# Patient Record
Sex: Female | Born: 1950 | Race: White | Hispanic: No | Marital: Married | Smoking: Never smoker
Health system: Southern US, Community
[De-identification: ages and names within clinical notes are randomized; demographics above are authoritative.]

## PROBLEM LIST (undated history)

## (undated) ENCOUNTER — Ambulatory Visit: Payer: Medicare HMO

## (undated) DIAGNOSIS — I10 Essential (primary) hypertension: Secondary | ICD-10-CM

## (undated) DIAGNOSIS — R42 Dizziness and giddiness: Secondary | ICD-10-CM

## (undated) DIAGNOSIS — E039 Hypothyroidism, unspecified: Secondary | ICD-10-CM

## (undated) DIAGNOSIS — K219 Gastro-esophageal reflux disease without esophagitis: Secondary | ICD-10-CM

## (undated) HISTORY — PX: BLADDER SURGERY: SHX569

## (undated) HISTORY — PX: COLONOSCOPY: SHX174

---

## 2005-02-23 ENCOUNTER — Ambulatory Visit: Payer: Self-pay | Admitting: Specialist

## 2005-03-07 ENCOUNTER — Emergency Department: Payer: Self-pay | Admitting: Emergency Medicine

## 2005-03-13 ENCOUNTER — Emergency Department: Payer: Self-pay | Admitting: Emergency Medicine

## 2005-03-15 ENCOUNTER — Emergency Department: Payer: Self-pay | Admitting: General Practice

## 2005-03-24 ENCOUNTER — Ambulatory Visit: Payer: Self-pay

## 2007-09-21 ENCOUNTER — Ambulatory Visit: Payer: Self-pay | Admitting: Internal Medicine

## 2007-11-06 ENCOUNTER — Ambulatory Visit: Payer: Self-pay | Admitting: Endocrinology

## 2008-04-20 ENCOUNTER — Ambulatory Visit: Payer: Self-pay | Admitting: Unknown Physician Specialty

## 2012-07-22 ENCOUNTER — Ambulatory Visit: Payer: Self-pay | Admitting: Emergency Medicine

## 2012-07-22 LAB — URINALYSIS, COMPLETE
Bacteria: NEGATIVE
Bilirubin,UR: NEGATIVE
Glucose,UR: NEGATIVE mg/dL (ref 0–75)
Ketone: NEGATIVE
Nitrite: NEGATIVE
Ph: 6.5 (ref 4.5–8.0)
Specific Gravity: 1.015 (ref 1.003–1.030)

## 2012-07-22 LAB — CBC WITH DIFFERENTIAL/PLATELET
Bands: 1 %
Basophil #: 0 10*3/uL (ref 0.0–0.1)
Basophil %: 0.5 %
Comment - H1-Com2: NORMAL
Eosinophil #: 0 10*3/uL (ref 0.0–0.7)
Eosinophil %: 0 %
Eosinophil: 1 %
HCT: 35.6 % (ref 35.0–47.0)
HGB: 12.1 g/dL (ref 12.0–16.0)
Lymphocyte #: 0.6 10*3/uL — ABNORMAL LOW (ref 1.0–3.6)
Lymphocyte %: 18.9 %
Lymphocytes: 18 %
MCH: 33.3 pg (ref 26.0–34.0)
MCHC: 34.1 g/dL (ref 32.0–36.0)
MCV: 98 fL (ref 80–100)
Monocyte #: 0.4 x10 3/mm (ref 0.2–0.9)
Monocyte %: 13.3 %
Monocytes: 13 %
Myelocyte: 1 %
Neutrophil #: 2.1 10*3/uL (ref 1.4–6.5)
Neutrophil %: 67.3 %
Platelet: 212 10*3/uL (ref 150–440)
RBC: 3.65 10*6/uL — ABNORMAL LOW (ref 3.80–5.20)
RDW: 12.3 % (ref 11.5–14.5)
Segmented Neutrophils: 66 %
WBC: 3.2 10*3/uL — ABNORMAL LOW (ref 3.6–11.0)

## 2012-07-22 LAB — COMPREHENSIVE METABOLIC PANEL
Albumin: 3.5 g/dL (ref 3.4–5.0)
Alkaline Phosphatase: 92 U/L (ref 50–136)
Anion Gap: 9 (ref 7–16)
BUN: 9 mg/dL (ref 7–18)
Bilirubin,Total: 0.4 mg/dL (ref 0.2–1.0)
Calcium, Total: 8.6 mg/dL (ref 8.5–10.1)
Chloride: 97 mmol/L — ABNORMAL LOW (ref 98–107)
Co2: 25 mmol/L (ref 21–32)
Creatinine: 0.74 mg/dL (ref 0.60–1.30)
EGFR (African American): >60
EGFR (Non-African Amer.): >60
Glucose: 105 mg/dL — ABNORMAL HIGH (ref 65–99)
Osmolality: 262 (ref 275–301)
Potassium: 3.4 mmol/L — ABNORMAL LOW (ref 3.5–5.1)
SGOT(AST): 34 U/L (ref 15–37)
SGPT (ALT): 35 U/L
Sodium: 131 mmol/L — ABNORMAL LOW (ref 136–145)
Total Protein: 7.1 g/dL (ref 6.4–8.2)

## 2012-07-24 LAB — URINE CULTURE

## 2012-11-08 ENCOUNTER — Emergency Department: Payer: Self-pay | Admitting: Emergency Medicine

## 2012-11-08 LAB — URINALYSIS, COMPLETE
Ketone: NEGATIVE
Protein: 100
RBC,UR: 256 /HPF (ref 0–5)
Specific Gravity: 1.014 (ref 1.003–1.030)
Squamous Epithelial: <1
WBC UR: 247 /HPF (ref 0–5)

## 2014-07-27 ENCOUNTER — Ambulatory Visit: Payer: Self-pay | Admitting: Unknown Physician Specialty

## 2014-07-30 LAB — PATHOLOGY REPORT

## 2017-07-17 ENCOUNTER — Other Ambulatory Visit
Admission: RE | Admit: 2017-07-17 | Discharge: 2017-07-17 | Disposition: A | Discharge status: Home / Self Care | Payer: Medicare Other | Source: Ambulatory Visit | Attending: Student | Admitting: Student

## 2017-07-17 DIAGNOSIS — R197 Diarrhea, unspecified: Secondary | ICD-10-CM | POA: Insufficient documentation

## 2017-07-17 LAB — GASTROINTESTINAL PANEL BY PCR, STOOL (REPLACES STOOL CULTURE)

## 2017-07-17 LAB — C DIFFICILE QUICK SCREEN W PCR REFLEX
C DIFFICLE (CDIFF) ANTIGEN: NEGATIVE
C Diff interpretation: NOT DETECTED
C Diff toxin: NEGATIVE

## 2017-07-23 LAB — PANCREATIC ELASTASE, FECAL

## 2017-07-24 LAB — CALPROTECTIN, FECAL: Calprotectin, Fecal: 39 ug/g (ref 0–120)

## 2017-08-29 ENCOUNTER — Other Ambulatory Visit: Payer: Self-pay | Admitting: Orthopedic Surgery

## 2017-08-29 DIAGNOSIS — M1612 Unilateral primary osteoarthritis, left hip: Secondary | ICD-10-CM

## 2017-08-31 NOTE — Discharge Instructions (Signed)
Hip Injection, Care After Refer to this sheet in the next few weeks. These instructions provide you with information about caring for yourself after your procedure. Your health care provider may also give you more specific instructions. Your treatment has been planned according to current medical practices, but problems sometimes occur. Call your health care provider if you have any problems or questions after your procedure. What can I expect after the procedure? After the procedure, it is common to have:  Soreness.  Warmth.  Swelling.  You may have more pain, swelling, and warmth than you did before the injection. This reaction may last for about one day. Follow these instructions at home: Bathing  If you were given a bandage (dressing), keep it dry until your health care provider says it can be removed. Ask your health care provider when you can start showering or taking a bath. Managing pain, stiffness, and swelling  If directed, apply ice to the injection area: ? Put ice in a plastic bag. ? Place a towel between your skin and the bag. ? Leave the ice on for 20 minutes, 2-3 times per day.  Do not apply heat to your knee.  Raise the injection area above the level of your heart while you are sitting or lying down. Activity  Avoid strenuous activities for as long as directed by your health care provider. Ask your health care provider when you can return to your normal activities. General instructions  Take medicines only as directed by your health care provider.  Do not take aspirin or other over-the-counter medicines unless your health care provider says you can.  Check your injection site every day for signs of infection. Watch for: ? Redness, swelling, or pain. ? Fluid, blood, or pus.  Follow your health care providers instructions about dressing changes and removal. Contact a health care provider if:  You have symptoms at your injection site that last longer than two  days after your procedure.  You have redness, swelling, or pain in your injection area.  You have fluid, blood, or pus coming from your injection site.  You have warmth in your injection area.  You have a fever.  Your pain is not controlled with medicine. Get help right away if:  Your hip turns very red.  Your hip becomes very swollen.  Your hip pain is severe. This information is not intended to replace advice given to you by your health care provider. Make sure you discuss any questions you have with your health care provider. Document Released: 01/01/2015 Document Revised: 08/16/2016 Document Reviewed: 10/21/2014 Elsevier Interactive Patient Education  [employer]2018 Elsevier Inc.

## 2017-09-03 ENCOUNTER — Encounter: Payer: Self-pay | Admitting: Radiology

## 2017-09-03 ENCOUNTER — Ambulatory Visit
Admission: RE | Admit: 2017-09-03 | Discharge: 2017-09-03 | Disposition: A | Discharge status: Home / Self Care | Payer: Medicare Other | Source: Ambulatory Visit | Attending: Orthopedic Surgery | Admitting: Orthopedic Surgery

## 2017-09-03 DIAGNOSIS — M1612 Unilateral primary osteoarthritis, left hip: Secondary | ICD-10-CM | POA: Diagnosis present

## 2017-09-03 MED ORDER — METHYLPREDNISOLONE ACETATE 80 MG/ML IJ SUSP
INTRAMUSCULAR | Status: AC
  Administered 2017-09-03: 1 mg
  Filled 2017-09-03: qty 1

## 2017-09-03 MED ORDER — IOPAMIDOL (ISOVUE-200) INJECTION 41%
15.0000 mL | Freq: Once | INTRAVENOUS | Status: AC | PRN
  Administered 2017-09-03: 15 mL
  Filled 2017-09-03: qty 50

## 2017-09-03 MED ORDER — LIDOCAINE HCL (PF) 1 % IJ SOLN
5.0000 mL | Freq: Once | INTRAMUSCULAR | Status: AC
Start: 2017-09-03 — End: 2017-09-03
  Administered 2017-09-03: 5 mL
  Filled 2017-09-03: qty 5

## 2017-09-03 MED ORDER — BUPIVACAINE HCL (PF) 0.25 % IJ SOLN
INTRAMUSCULAR | Status: AC
  Administered 2017-09-03: 7 mL
  Filled 2017-09-03: qty 30

## 2019-01-15 ENCOUNTER — Other Ambulatory Visit: Payer: Self-pay | Admitting: Endocrinology

## 2019-01-15 DIAGNOSIS — Z1231 Encounter for screening mammogram for malignant neoplasm of breast: Secondary | ICD-10-CM

## 2019-02-19 ENCOUNTER — Ambulatory Visit
Admission: RE | Admit: 2019-02-19 | Discharge: 2019-02-19 | Disposition: A | Discharge status: Home / Self Care | Payer: Medicare HMO | Source: Ambulatory Visit | Attending: Endocrinology | Admitting: Endocrinology

## 2019-02-19 ENCOUNTER — Encounter: Payer: Self-pay | Admitting: Radiology

## 2019-02-19 DIAGNOSIS — Z1231 Encounter for screening mammogram for malignant neoplasm of breast: Secondary | ICD-10-CM | POA: Insufficient documentation

## 2020-01-23 ENCOUNTER — Ambulatory Visit: Payer: Medicare HMO

## 2020-01-31 ENCOUNTER — Ambulatory Visit: Payer: Medicare HMO | Attending: Internal Medicine

## 2020-01-31 DIAGNOSIS — Z23 Encounter for immunization: Secondary | ICD-10-CM | POA: Insufficient documentation

## 2020-01-31 NOTE — Progress Notes (Signed)
   Covid-19 Vaccination Clinic  Name:  Jasmine Robertson    MRN: [id number] DOB: March 02, 1951  01/31/2020  Jasmine Robertson was observed post Covid-19 immunization for 15 minutes without incidence. She was provided with Vaccine Information Sheet and instruction to access the V-Safe system.   Jasmine Robertson was instructed to call 911 with any severe reactions post vaccine: Marland Kitchen Difficulty breathing  . Swelling of your face and throat  . A fast heartbeat  . A bad rash all over your body  . Dizziness and weakness    Immunizations Administered    Name Date Dose VIS Date Route   Pfizer COVID-19 Vaccine 01/31/2020  3:12 PM 0.3 mL 12/05/2019 Intramuscular   Manufacturer: [employer], [employer]   Lot: [id number]   NDC: [address]-3

## 2020-02-13 ENCOUNTER — Ambulatory Visit: Payer: Medicare HMO

## 2020-02-25 ENCOUNTER — Ambulatory Visit: Payer: Medicare HMO

## 2020-02-26 ENCOUNTER — Ambulatory Visit: Payer: Medicare HMO | Attending: Internal Medicine

## 2020-02-26 DIAGNOSIS — Z23 Encounter for immunization: Secondary | ICD-10-CM | POA: Insufficient documentation

## 2020-02-26 NOTE — Progress Notes (Signed)
   Covid-19 Vaccination Clinic  Name:  Jasmine Robertson    MRN: [id number] DOB: [birth year]  02/26/2020  [name] was observed post Covid-19 immunization for 15 minutes without incident. She was provided with Vaccine Information Sheet and instruction to access the V-Safe system.   [name] was instructed to call 911 with any severe reactions post vaccine: Marland Kitchen Difficulty breathing  . Swelling of face and throat  . A fast heartbeat  . A bad rash all over body  . Dizziness and weakness   Immunizations Administered    Name Date Dose VIS Date Route   Pfizer COVID-19 Vaccine 02/26/2020  5:23 PM 0.3 mL 12/05/2019 Intramuscular   Manufacturer: [employer], [employer]   Lot: [id number]   NDC: [address]-3

## 2020-04-21 ENCOUNTER — Other Ambulatory Visit: Payer: Self-pay | Admitting: Neurosurgery

## 2020-04-21 DIAGNOSIS — S32810K Multiple fractures of pelvis with stable disruption of pelvic ring, subsequent encounter for fracture with nonunion: Secondary | ICD-10-CM

## 2020-04-26 ENCOUNTER — Other Ambulatory Visit: Payer: Self-pay | Admitting: Endocrinology

## 2020-04-26 DIAGNOSIS — Z1231 Encounter for screening mammogram for malignant neoplasm of breast: Secondary | ICD-10-CM

## 2020-04-27 ENCOUNTER — Ambulatory Visit
Admission: RE | Admit: 2020-04-27 | Discharge: 2020-04-27 | Disposition: A | Discharge status: Home / Self Care | Payer: Medicare HMO | Source: Ambulatory Visit | Attending: Endocrinology | Admitting: Endocrinology

## 2020-04-27 ENCOUNTER — Other Ambulatory Visit: Payer: Self-pay

## 2020-04-27 DIAGNOSIS — Z1231 Encounter for screening mammogram for malignant neoplasm of breast: Secondary | ICD-10-CM | POA: Insufficient documentation

## 2020-05-05 ENCOUNTER — Ambulatory Visit
Admission: RE | Admit: 2020-05-05 | Discharge: 2020-05-05 | Disposition: A | Discharge status: Home / Self Care | Payer: Medicare HMO | Source: Ambulatory Visit | Attending: Neurosurgery | Admitting: Neurosurgery

## 2020-05-05 ENCOUNTER — Other Ambulatory Visit: Payer: Self-pay

## 2020-05-05 DIAGNOSIS — S32810K Multiple fractures of pelvis with stable disruption of pelvic ring, subsequent encounter for fracture with nonunion: Secondary | ICD-10-CM | POA: Diagnosis present

## 2020-05-05 DIAGNOSIS — X58XXXA Exposure to other specified factors, initial encounter: Secondary | ICD-10-CM | POA: Insufficient documentation

## 2020-05-05 DIAGNOSIS — M7061 Trochanteric bursitis, right hip: Secondary | ICD-10-CM | POA: Insufficient documentation

## 2020-05-05 DIAGNOSIS — M160 Bilateral primary osteoarthritis of hip: Secondary | ICD-10-CM | POA: Insufficient documentation

## 2020-05-05 DIAGNOSIS — M47816 Spondylosis without myelopathy or radiculopathy, lumbar region: Secondary | ICD-10-CM | POA: Insufficient documentation

## 2020-05-05 DIAGNOSIS — M7062 Trochanteric bursitis, left hip: Secondary | ICD-10-CM | POA: Diagnosis not present

## 2020-11-26 ENCOUNTER — Other Ambulatory Visit: Payer: Self-pay | Admitting: Internal Medicine

## 2020-11-26 ENCOUNTER — Ambulatory Visit: Payer: Medicare HMO | Attending: Internal Medicine

## 2020-11-26 DIAGNOSIS — Z23 Encounter for immunization: Secondary | ICD-10-CM

## 2020-11-26 NOTE — Progress Notes (Signed)
   Covid-19 Vaccination Clinic  Name:  Jasmine Robertson    MRN: [id number] DOB: 11/07/1951  11/26/2020  [name] was observed post Covid-19 immunization for 15 minutes without incident. She was provided with Vaccine Information Sheet and instruction to access the V-Safe system.   [name] was instructed to call 911 with any severe reactions post vaccine: Marland Kitchen Difficulty breathing  . Swelling of face and throat  . A fast heartbeat  . A bad rash all over body  . Dizziness and weakness   Immunizations Administered    Name Date Dose VIS Date Route   Pfizer COVID-19 Vaccine 11/26/2020  1:31 PM 0.3 mL 10/13/2020 Intramuscular   Manufacturer: [employer], [employer]   Lot: [id number]   NDC: [address]-4

## 2021-06-02 ENCOUNTER — Other Ambulatory Visit: Payer: Self-pay | Admitting: Endocrinology

## 2021-06-02 DIAGNOSIS — Z1231 Encounter for screening mammogram for malignant neoplasm of breast: Secondary | ICD-10-CM

## 2021-06-15 ENCOUNTER — Other Ambulatory Visit: Payer: Self-pay

## 2021-06-15 ENCOUNTER — Ambulatory Visit
Admission: RE | Admit: 2021-06-15 | Discharge: 2021-06-15 | Disposition: A | Discharge status: Home / Self Care | Payer: Medicare HMO | Source: Ambulatory Visit | Attending: Endocrinology | Admitting: Endocrinology

## 2021-06-15 DIAGNOSIS — Z1231 Encounter for screening mammogram for malignant neoplasm of breast: Secondary | ICD-10-CM | POA: Insufficient documentation

## 2021-06-26 ENCOUNTER — Ambulatory Visit
Admission: EM | Admit: 2021-06-26 | Discharge: 2021-06-26 | Disposition: A | Discharge status: Home / Self Care | Payer: Medicare HMO

## 2021-06-26 ENCOUNTER — Other Ambulatory Visit: Payer: Self-pay

## 2021-06-26 ENCOUNTER — Encounter: Payer: Self-pay | Admitting: Emergency Medicine

## 2021-06-26 DIAGNOSIS — K209 Esophagitis, unspecified without bleeding: Secondary | ICD-10-CM

## 2021-06-26 IMAGING — MG DIGITAL SCREENING BILAT W/ TOMO W/ CAD
8 series · 8 of 24 positions shown · non-contrast
Comparison: Previous exam(s).

CLINICAL DATA: Screening.

EXAM:
DIGITAL SCREENING BILATERAL MAMMOGRAM WITH TOMO AND CAD

[R CC synth-2D]
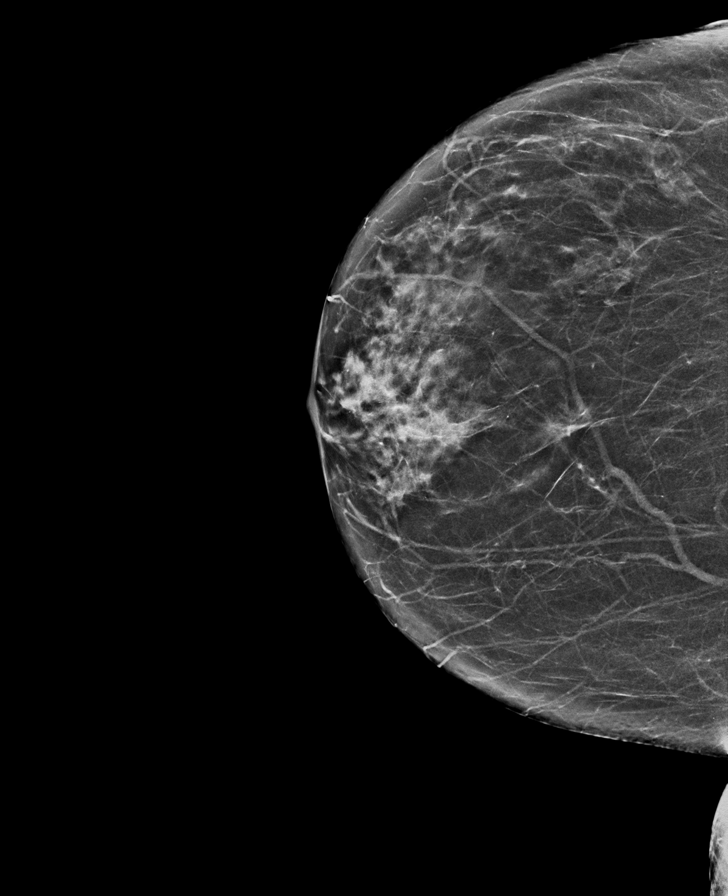

[R MLO synth-2D]
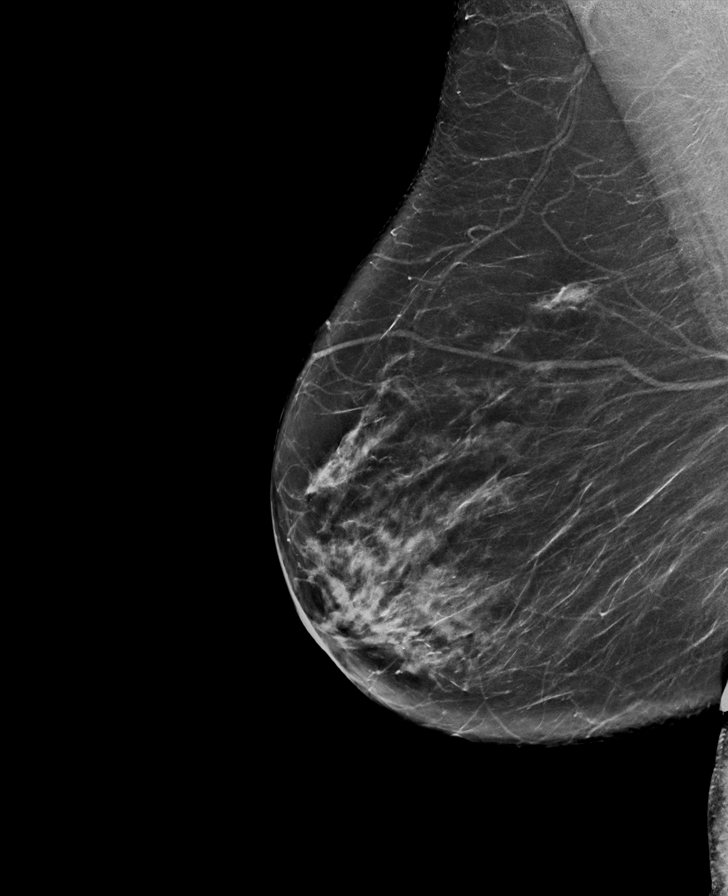

[L MLO synth-2D]
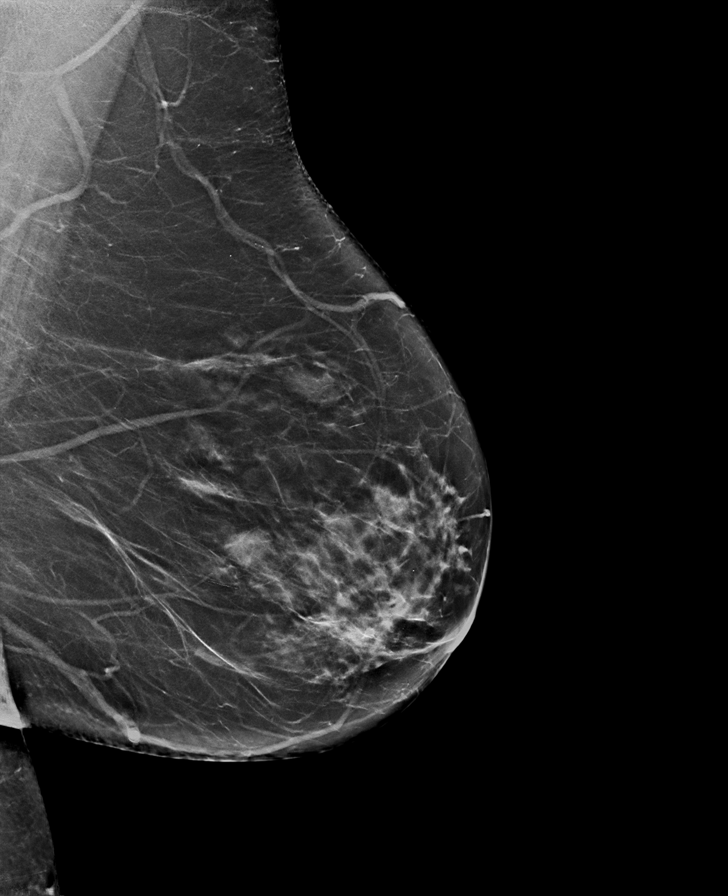

[L CC synth-2D]
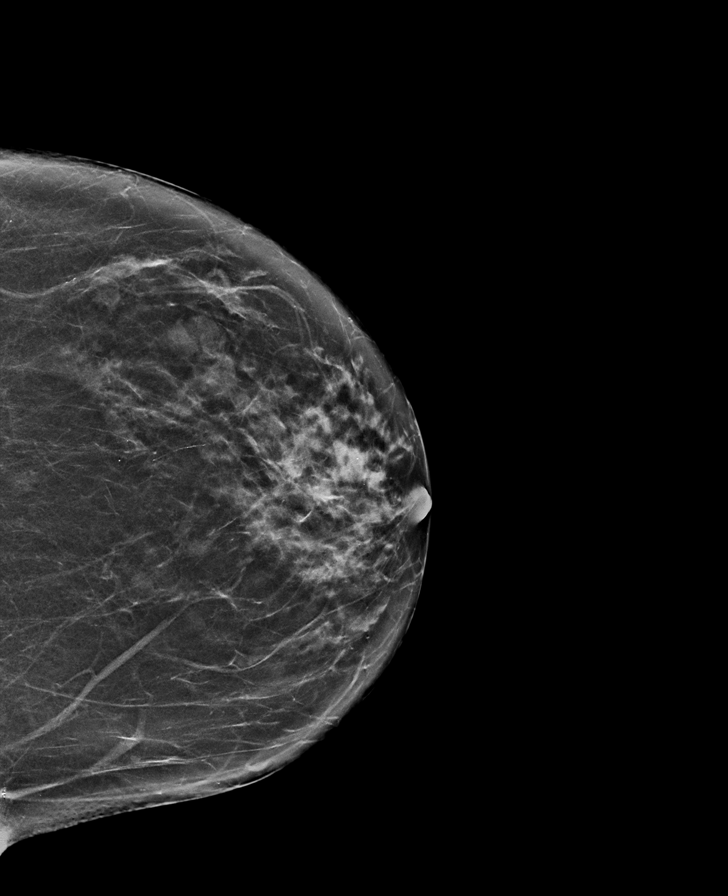

[L MLO tomo · tomo slice 38/75.0]
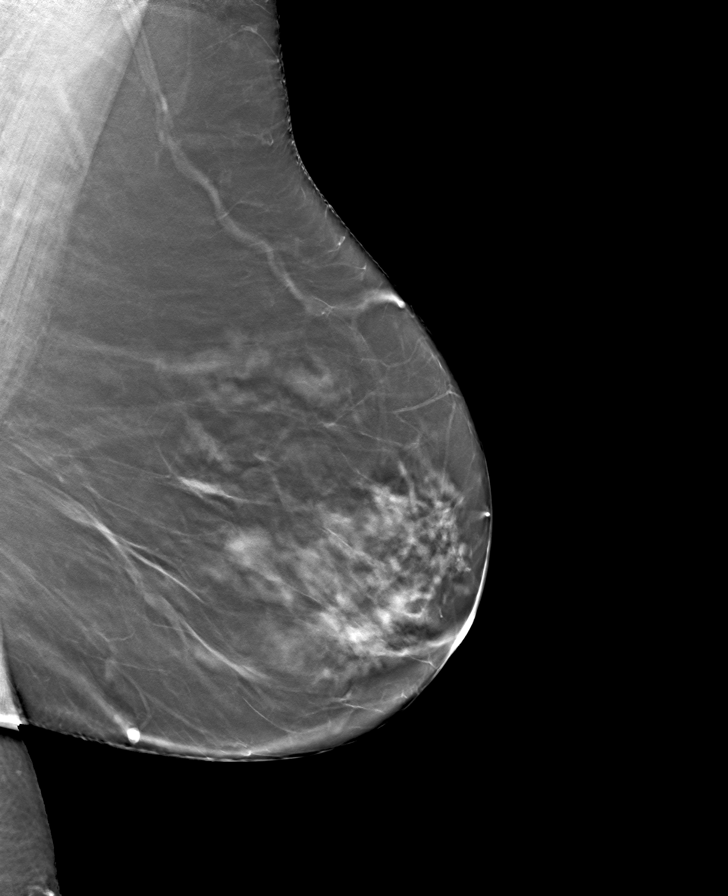

[R MLO tomo · tomo slice 39/77.0]
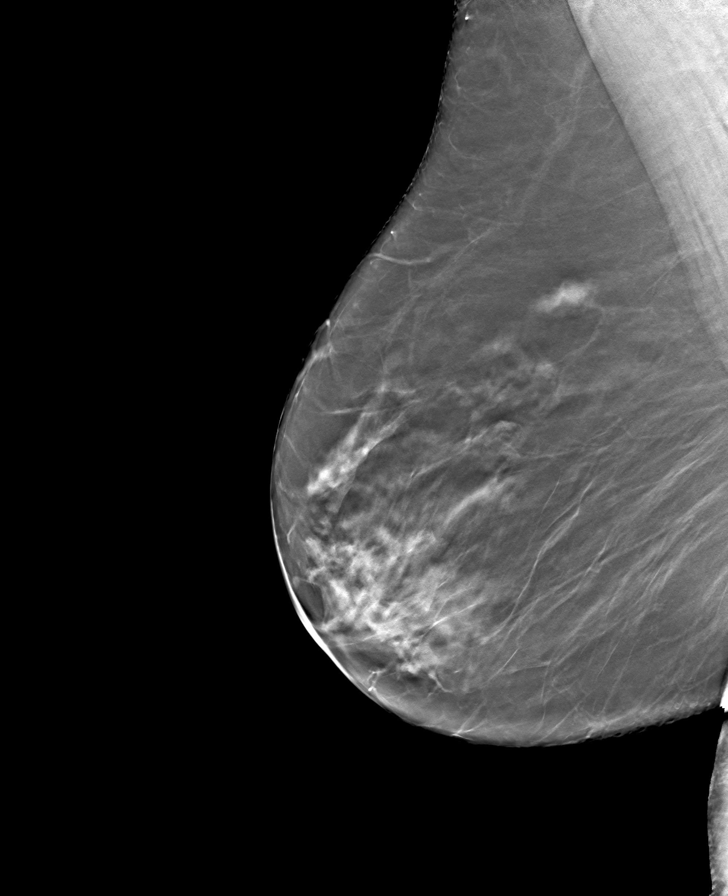

[L CC tomo · tomo slice 33/65.0]
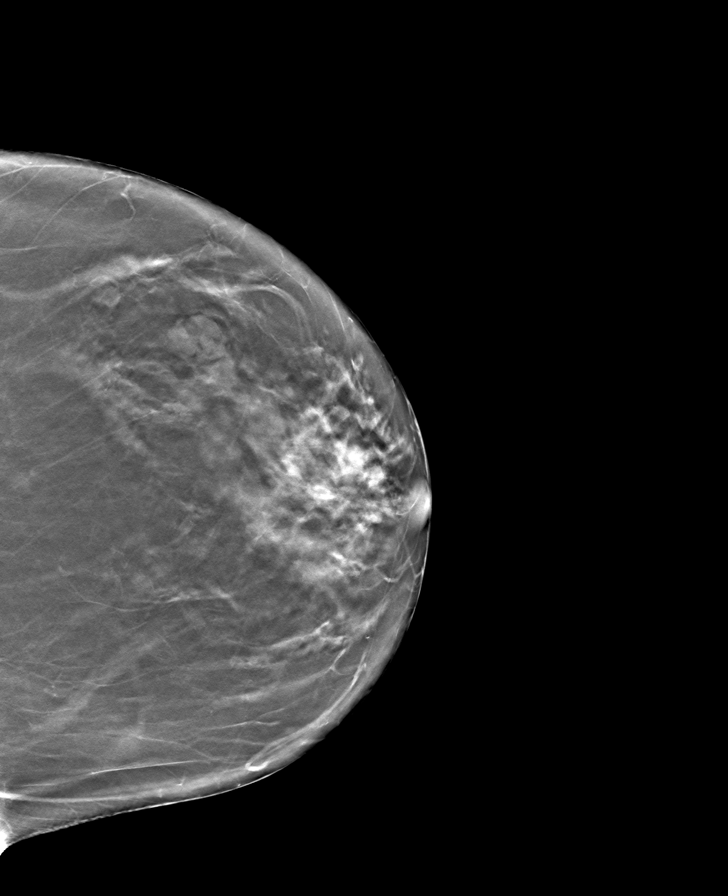

[R CC tomo · tomo slice 33/64.0]
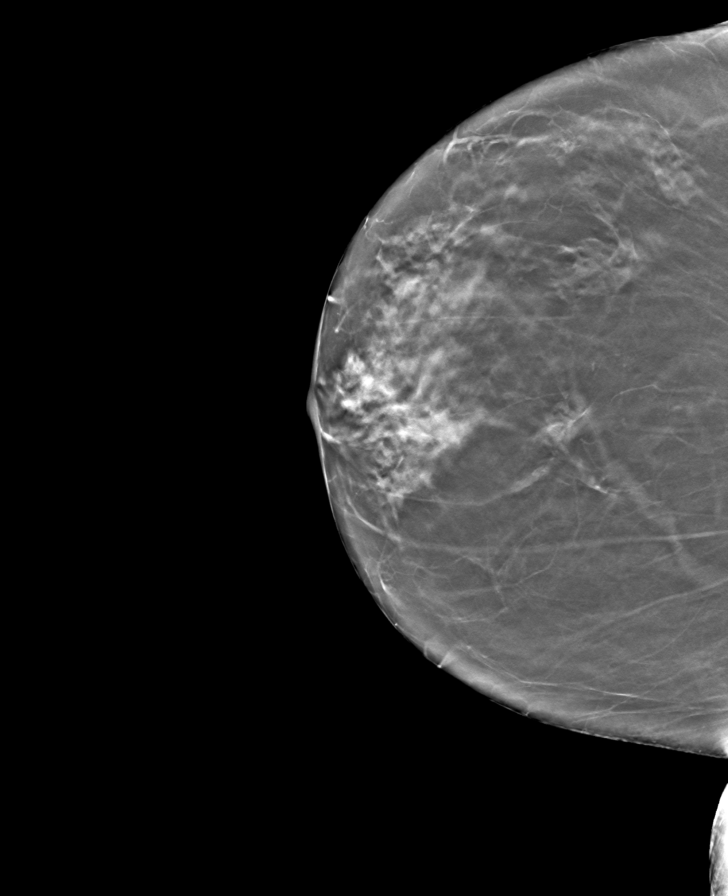

[8 of 24 positions shown; findings below may reference images not displayed]

ACR Breast Density Category c: The breast tissue is heterogeneously
dense, which may obscure small masses.
FINDINGS: There are no findings suspicious for malignancy. Images were
processed with CAD.
IMPRESSION: No mammographic evidence of malignancy. A result letter of this
screening mammogram will be mailed directly to the patient.

RECOMMENDATION:
Screening mammogram in one year. (Code:[id number])

BI-RADS CATEGORY  1: Negative.

## 2021-06-26 MED ORDER — ALUM & MAG HYDROXIDE-SIMETH 200-200-20 MG/5ML PO SUSP
30.0000 mL | Freq: Once | ORAL | Status: AC
  Administered 2021-06-26: 30 mL via ORAL

## 2021-06-26 MED ORDER — LIDOCAINE VISCOUS HCL 2 % MT SOLN
15.0000 mL | Freq: Once | OROMUCOSAL | Status: AC
  Administered 2021-06-26: 15 mL via ORAL

## 2021-06-26 NOTE — ED Triage Notes (Signed)
Pt presents today with c/o of mid chest pain that began this morning. She reports feeling better after belching several times, but continues to complain of chest pain described as "pressure".

## 2021-06-26 NOTE — Discharge Instructions (Addendum)
Continue your pantoprazole for treatment of your reflux disease and add on over-the-counter Gaviscon liquid up to 4 times a day to help coat your esophagus and decrease your symptomology.  Make a follow-up appointment with gastroenterology to discuss your symptoms as you may need to be tested for H. pylori or have another endoscopy.  ED if you develop an increase in your chest pain, especially associated with nausea, sweating, or radiation to your arm or neck you need to go to the emergency department for evaluation.

## 2021-06-26 NOTE — ED Provider Notes (Signed)
[facility]    CSN: [id number] Arrival date & time: 06/26/21  1400      History   Chief Complaint Chief Complaint  Patient presents with   Chest Pain    HPI JUNELLA DOMKE is a 70 y.o. female.   HPI  71 year old female here for evaluation of midsternal chest pressure.  Patient reports that she has been dealing with midsternal chest pressure since she woke up this morning.  She states that she was belching excessively which has helped some of the pressure in the middle of her chest but she is still having that midsternal pressure rating it 3-4 over 10.  This is associated with nausea.  She also reports that since she has had the belching that she has been having pain whenever she swallows food or fluids.  The pain/pressure does not radiate.  It is not associated with shortness of breath or sweating.  Patient has no prior cardiac history but does have a history of gastritis and esophagitis with gastroesophageal reflux disease and is on pantoprazole at present.  Patient's last EGD was in 2018 which showed the gastritis.  History reviewed. No pertinent past medical history.  There are no problems to display for this patient.   History reviewed. No pertinent surgical history.  OB History   No obstetric history on file.      Home Medications    Prior to Admission medications   Medication Sig Start Date End Date Taking? Authorizing Provider  alendronate (FOSAMAX) 70 MG tablet Take by mouth. 03/09/21  Yes [provider]  levothyroxine (SYNTHROID) 50 MCG tablet Take by mouth. 06/19/18  Yes [provider]  pantoprazole (PROTONIX) 40 MG tablet Take 40 mg by mouth daily.   Yes [provider]  amoxicillin (AMOXIL) 500 MG capsule Take 2,000 mg by mouth once. Prior to dental procedure    [provider]  aspirin 81 MG EC tablet aspirin 81 mg tablet,delayed release  Take 1 tablet every day by oral route.    [provider]   aspirin EC 325 MG tablet Take 325 mg by mouth daily.    [provider]  azithromycin (ZITHROMAX) 250 MG tablet Take by mouth daily.    [provider]  benzonatate (TESSALON) 100 MG capsule Take by mouth 3 (three) times daily as needed for cough.    [provider]  calcium-vitamin D (OSCAL WITH D) 250-125 MG-UNIT tablet Take 1 tablet by mouth daily.    [provider]  COVID-19 mRNA vaccine, Pfizer, 30 MCG/0.3ML injection USE AS DIRECTED 11/26/20 11/26/21  Judyann Munson, MD  cyanocobalamin 1000 MCG tablet Take 1,000 mcg by mouth daily.    [provider]  doxepin (SINEQUAN) 50 MG capsule Take 50 mg by mouth at bedtime. 06/20/21   [provider]  metoprolol succinate (TOPROL-XL) 25 MG 24 hr tablet Take by mouth. 06/05/21   [provider]  oxycodone (OXY-IR) 5 MG capsule Take 5 mg by mouth every 4 (four) hours as needed.    [provider]  simvastatin (ZOCOR) 20 MG tablet Take 20 mg by mouth daily. 05/30/21   [provider]  terbinafine (LAMISIL) 250 MG tablet Take 250 mg by mouth daily. 06/15/21   [provider]  traMADol (ULTRAM) 50 MG tablet Take by mouth every 6 (six) hours as needed.    [provider]    Family History Family History  Problem Relation Age of Onset   Breast cancer Mother  66       x2    Social History Social History   Tobacco Use   Smoking status: Never   Smokeless tobacco: Never  Substance Use Topics   Alcohol use: Never   Drug use: Never     Allergies   Patient has no known allergies.   Review of Systems Review of Systems  Constitutional:  Negative for activity change, appetite change, diaphoresis and fever.  Respiratory:  Negative for shortness of breath and wheezing.   Cardiovascular:  Positive for chest pain. Negative for palpitations.  Gastrointestinal:  Positive for nausea. Negative for vomiting.  Skin:  Negative for rash.  Hematological:  Negative.   Psychiatric/Behavioral: Negative.      Physical Exam Triage Vital Signs ED Triage Vitals  Enc Vitals Group     BP 06/26/21 1415 137/68     Pulse Rate 06/26/21 1415 80     Resp 06/26/21 1415 16     Temp 06/26/21 1415 97.9 F (36.6 C)     Temp Source 06/26/21 1415 Oral     SpO2 06/26/21 1415 100 %     Weight 06/26/21 1410 171 lb (77.6 kg)     Height 06/26/21 1410 5\' 5"  (1.651 m)     Head Circumference --      Peak Flow --      Pain Score 06/26/21 1409 4     Pain Loc --      Pain Edu? --      Excl. in GC? --    No data found.  Updated Vital Signs BP 137/68 (BP Location: Right Arm)   Pulse 80   Temp 97.9 F (36.6 C) (Oral)   Resp 16   Ht 5\' 5"  (1.651 m)   Wt 171 lb (77.6 kg)   SpO2 100%   BMI 28.46 kg/m   Visual Acuity Right Eye Distance:   Left Eye Distance:   Bilateral Distance:    Right Eye Near:   Left Eye Near:    Bilateral Near:     Physical Exam   UC Treatments / Results  Labs (all labs ordered are listed, but only abnormal results are displayed) Labs Reviewed - No data to display  EKG   Radiology No results found.  Procedures Procedures (including critical care time)  Medications Ordered in UC Medications  alum & mag hydroxide-simeth (MAALOX/MYLANTA) 200-200-20 MG/5ML suspension 30 mL (30 mLs Oral Given 06/26/21 1452)    And  lidocaine (XYLOCAINE) 2 % viscous mouth solution 15 mL (15 mLs Oral Given 06/26/21 1452)    Initial Impression / Assessment and Plan / UC Course  I have reviewed the triage vital signs and the nursing notes.  Pertinent labs & imaging results that were available during my care of the patient were reviewed by me and considered in my medical decision making (see chart for details).  Patient is a very pleasant and nontoxic-appearing 70 year old female here for evaluation of mid chest pressure as outlined in HPI above.  Patient's physical exam reveals a benign cardiopulmonary exam.  Abdomen is protuberant but  soft and nontender.  EKG collected at triage shows normal sinus rhythm with a ventricular rate of 78 bpm, PR interval 160 ms, QRS duration of 102 ms, QT 398 ms, and QTc 453 ms there appears to be an incomplete left bundle branch block which I am unsure if that is new or old as we have no prior EKGs for comparison.  There is definite concern that patient's chest  pain is cardiac in nature but may also be associated with esophagitis or esophageal spasm.  Will administer GI cocktail to see if patient's symptoms improve or resolve.  Patient vies that if the GI cocktail does not resolve her chest pressure that she needs to be evaluated in the emergency department to rule out a cardiac cause of her chest pressure.  Patient is agreeable and verbalizes understanding.  Reports that she has had significant improvement of her chest pressure since taking the GI cocktail.  Will discharge patient home and have her continue her pantoprazole and have her add on over-the-counter Gaviscon to help with esophageal inflammation.  Of also encouraged her to follow-up with gastroenterology if her symptoms continue.   Final Clinical Impressions(s) / UC Diagnoses   Final diagnoses:  Acute esophagitis     Discharge Instructions      Continue your pantoprazole for treatment of your reflux disease and add on over-the-counter Gaviscon liquid up to 4 times a day to help coat your esophagus and decrease your symptomology.  Make a follow-up appointment with gastroenterology to discuss your symptoms as you may need to be tested for H. pylori or have another endoscopy.  ED if you develop an increase in your chest pain, especially associated with nausea, sweating, or radiation to your arm or neck you need to go to the emergency department for evaluation.     ED Prescriptions   None    PDMP not reviewed this encounter.   Becky Augusta, NP 06/26/21 1520

## 2022-05-19 ENCOUNTER — Other Ambulatory Visit: Payer: Self-pay | Admitting: Endocrinology

## 2022-05-19 DIAGNOSIS — Z1231 Encounter for screening mammogram for malignant neoplasm of breast: Secondary | ICD-10-CM

## 2022-06-20 ENCOUNTER — Ambulatory Visit
Admission: RE | Admit: 2022-06-20 | Discharge: 2022-06-20 | Disposition: A | Discharge status: Home / Self Care | Payer: Medicare HMO | Source: Ambulatory Visit | Attending: Endocrinology | Admitting: Endocrinology

## 2022-06-20 DIAGNOSIS — Z1231 Encounter for screening mammogram for malignant neoplasm of breast: Secondary | ICD-10-CM | POA: Insufficient documentation

## 2022-07-30 LAB — COLOGUARD

## 2022-08-11 ENCOUNTER — Other Ambulatory Visit: Payer: Self-pay

## 2022-08-11 ENCOUNTER — Telehealth: Payer: Self-pay

## 2022-08-11 DIAGNOSIS — K219 Gastro-esophageal reflux disease without esophagitis: Secondary | ICD-10-CM

## 2022-08-11 DIAGNOSIS — R195 Other fecal abnormalities: Secondary | ICD-10-CM

## 2022-08-11 MED ORDER — NA SULFATE-K SULFATE-MG SULF 17.5-3.13-1.6 GM/177ML PO SOLN: 1.0000 | Freq: Once | ORAL | 0 refills | Status: AC

## 2022-08-11 NOTE — Telephone Encounter (Signed)
Gastroenterology Pre-Procedure Review  Request Date: 09/28/22 Requesting Physician: Dr. Servando Snare  PATIENT REVIEW QUESTIONS: The patient responded to the following health history questions as indicated:    1. Are you having any GI issues? yes (positive colorectal screening, acid reflux patient request upper endoscopy) 2. Do you have a personal history of Polyps? no 3. Do you have a family history of Colon Cancer or Polyps? yes (brother died at the age of 3 with rectal cancer) 4. Diabetes Mellitus? no 5. Joint replacements in the past 12 months?no 6. Major health problems in the past 3 months?no 7. Any artificial heart valves, MVP, or defibrillator?no    MEDICATIONS & ALLERGIES:    Patient reports the following regarding taking any anticoagulation/antiplatelet therapy:   Plavix, Coumadin, Eliquis, Xarelto, Lovenox, Pradaxa, Brilinta, or Effient? no Aspirin? Not taking Aspirin 325mg  taking Aspirin 81mg   Patient confirms/reports the following medications:  Current Outpatient Medications  Medication Sig Dispense Refill   alendronate (FOSAMAX) 70 MG tablet Take by mouth.     amoxicillin (AMOXIL) 500 MG capsule Take 2,000 mg by mouth once. Prior to dental procedure     aspirin 81 MG EC tablet aspirin 81 mg tablet,delayed release  Take 1 tablet every day by oral route.     aspirin EC 325 MG tablet Take 325 mg by mouth daily.     azithromycin (ZITHROMAX) 250 MG tablet Take by mouth daily.     benzonatate (TESSALON) 100 MG capsule Take by mouth 3 (three) times daily as needed for cough.     calcium-vitamin D (OSCAL WITH D) 250-125 MG-UNIT tablet Take 1 tablet by mouth daily.     cyanocobalamin 1000 MCG tablet Take 1,000 mcg by mouth daily.     doxepin (SINEQUAN) 50 MG capsule Take 50 mg by mouth at bedtime.     levothyroxine (SYNTHROID) 50 MCG tablet Take by mouth.     metoprolol succinate (TOPROL-XL) 25 MG 24 hr tablet Take by mouth.     oxycodone (OXY-IR) 5 MG capsule Take 5 mg by mouth every  4 (four) hours as needed.     pantoprazole (PROTONIX) 40 MG tablet Take 40 mg by mouth daily.     simvastatin (ZOCOR) 20 MG tablet Take 20 mg by mouth daily.     terbinafine (LAMISIL) 250 MG tablet Take 250 mg by mouth daily.     traMADol (ULTRAM) 50 MG tablet Take by mouth every 6 (six) hours as needed.     No current facility-administered medications for this visit.    Patient confirms/reports the following allergies:  No Known Allergies  No orders of the defined types were placed in this encounter.   AUTHORIZATION INFORMATION Primary Insurance: 1D#: Group #:  Secondary Insurance: 1D#: Group #:  SCHEDULE INFORMATION: Date: 09/28/22 Time: Location: [facility]

## 2022-08-11 NOTE — Telephone Encounter (Signed)
Patient has been informed that Dr. Servando Snare will be able to perform EGD.  I've asked [facility] to add EGD to patients colonoscopy.

## 2022-08-14 ENCOUNTER — Encounter: Payer: Self-pay | Admitting: Endocrinology

## 2022-09-20 ENCOUNTER — Other Ambulatory Visit: Payer: Self-pay

## 2022-09-20 ENCOUNTER — Encounter: Payer: Self-pay | Admitting: Gastroenterology

## 2022-09-21 ENCOUNTER — Encounter: Payer: Self-pay | Admitting: Anesthesiology

## 2022-09-28 ENCOUNTER — Ambulatory Visit
Admission: RE | Admit: 2022-09-28 | Discharge: 2022-09-28 | Disposition: A | Discharge status: Home / Self Care | Payer: Medicare HMO | Attending: Gastroenterology | Admitting: Gastroenterology

## 2022-09-28 ENCOUNTER — Encounter: Payer: Self-pay | Admitting: Gastroenterology

## 2022-09-28 ENCOUNTER — Encounter
Admission: RE | Disposition: A | Discharge status: Home / Self Care | Payer: Self-pay | Source: Home / Self Care | Attending: Gastroenterology

## 2022-09-28 ENCOUNTER — Other Ambulatory Visit: Payer: Self-pay

## 2022-09-28 HISTORY — DX: Gastro-esophageal reflux disease without esophagitis: K21.9

## 2022-09-28 HISTORY — DX: Hypothyroidism, unspecified: E03.9

## 2022-09-28 SURGERY — COLONOSCOPY WITH PROPOFOL
Anesthesia: Choice

## 2022-09-28 SURGICAL SUPPLY — 36 items
BALLN DILATOR 12-15 8 (BALLOONS)
BALLN DILATOR 15-18 8 (BALLOONS)
BALLN DILATOR CRE 0-12 8 (BALLOONS)
BALLN DILATOR ESOPH 8 10 CRE (MISCELLANEOUS) IMPLANT
BALLOON DILATOR 12-15 8 (BALLOONS) IMPLANT
BALLOON DILATOR 15-18 8 (BALLOONS) IMPLANT
BALLOON DILATOR CRE 0-12 8 (BALLOONS) IMPLANT
BLOCK BITE 60FR ADLT L/F GRN (MISCELLANEOUS) ×1 IMPLANT
CLIP HMST 235XBRD CATH ROT (MISCELLANEOUS) IMPLANT
CLIP RESOLUTION 360 11X235 (MISCELLANEOUS)
ELECT REM PT RETURN 9FT ADLT (ELECTROSURGICAL)
ELECTRODE REM PT RTRN 9FT ADLT (ELECTROSURGICAL) IMPLANT
FCP ESCP3.2XJMB 240X2.8X (MISCELLANEOUS)
FORCEPS BIOP RAD 4 LRG CAP 4 (CUTTING FORCEPS) IMPLANT
FORCEPS BIOP RJ4 240 W/NDL (MISCELLANEOUS)
FORCEPS ESCP3.2XJMB 240X2.8X (MISCELLANEOUS) IMPLANT
GOWN CVR UNV OPN BCK APRN NK (MISCELLANEOUS) ×2 IMPLANT
GOWN ISOL THUMB LOOP REG UNIV (MISCELLANEOUS) ×2
INJECTOR VARIJECT VIN23 (MISCELLANEOUS) IMPLANT
KIT DEFENDO VALVE AND CONN (KITS) IMPLANT
KIT PRC NS LF DISP ENDO (KITS) ×1 IMPLANT
KIT PROCEDURE OLYMPUS (KITS) ×1
MANIFOLD NEPTUNE II (INSTRUMENTS) ×1 IMPLANT
MARKER SPOT ENDO TATTOO 5ML (MISCELLANEOUS) IMPLANT
PROBE APC STR FIRE (PROBE) IMPLANT
RETRIEVER NET PLAT FOOD (MISCELLANEOUS) IMPLANT
RETRIEVER NET ROTH 2.5X230 LF (MISCELLANEOUS) IMPLANT
SNARE COLD EXACTO (MISCELLANEOUS) IMPLANT
SNARE SHORT THROW 13M SML OVAL (MISCELLANEOUS) IMPLANT
SNARE SHORT THROW 30M LRG OVAL (MISCELLANEOUS) IMPLANT
SNARE SNG USE RND 15MM (INSTRUMENTS) IMPLANT
SYR INFLATION 60ML (SYRINGE) IMPLANT
TRAP ETRAP POLY (MISCELLANEOUS) IMPLANT
VARIJECT INJECTOR VIN23 (MISCELLANEOUS)
WATER STERILE IRR 250ML POUR (IV SOLUTION) ×1 IMPLANT
WIRE CRE 18-20MM 8CM F G (MISCELLANEOUS) IMPLANT

## 2022-09-28 NOTE — Progress Notes (Signed)
Procedure cancelled before the patient was brought to the endoscopy room due to a new irregular heart beat.

## 2022-09-28 NOTE — H&P (Addendum)
Lucilla Lame, MD [facility] [address], [place], [place] [address] Phone:[phone] Fax : [phone]  Primary Care Physician:  Lenard Simmer, MD Primary Gastroenterologist:  Dr. Allen Norris  Pre-Procedure History & Physical: HPI:  Jasmine Robertson is a 71 y.o. female is here for an colonoscopy and upper endoscopy.   Past Medical History:  Diagnosis Date   GERD (gastroesophageal reflux disease)    Hypothyroidism     Past Surgical History:  Procedure Laterality Date   BLADDER SURGERY      Prior to Admission medications   Medication Sig Start Date End Date Taking? Authorizing Provider  alendronate (FOSAMAX) 70 MG tablet Take by mouth. Patient not taking: Reported on 09/20/2022 03/09/21   [provider]  amoxicillin (AMOXIL) 500 MG capsule Take 2,000 mg by mouth once. Prior to dental procedure Patient not taking: Reported on 09/20/2022    [provider]  aspirin 81 MG EC tablet aspirin 81 mg tablet,delayed release  Take 1 tablet every day by oral route.    [provider]  aspirin EC 325 MG tablet Take 325 mg by mouth daily. Patient not taking: Reported on 09/20/2022    [provider]  azithromycin (ZITHROMAX) 250 MG tablet Take by mouth daily. Patient not taking: Reported on 09/20/2022    [provider]  benzonatate (TESSALON) 100 MG capsule Take by mouth 3 (three) times daily as needed for cough. Patient not taking: Reported on 09/20/2022    [provider]  calcium-vitamin D (OSCAL WITH D) 250-125 MG-UNIT tablet Take 1 tablet by mouth daily. Patient not taking: Reported on 09/20/2022    [provider]  cyanocobalamin 1000 MCG tablet Take 1,000 mcg by mouth daily. Patient not taking: Reported on 09/20/2022    [provider]  doxepin (SINEQUAN) 50 MG capsule Take 50 mg by mouth at bedtime. Patient not taking: Reported on 09/20/2022 06/20/21   [provider]  levothyroxine (SYNTHROID) 50 MCG  tablet Take by mouth. 06/19/18   [provider]  metoprolol succinate (TOPROL-XL) 25 MG 24 hr tablet Take by mouth. 06/05/21   [provider]  oxycodone (OXY-IR) 5 MG capsule Take 5 mg by mouth every 4 (four) hours as needed. Patient not taking: Reported on 09/20/2022    [provider]  pantoprazole (PROTONIX) 40 MG tablet Take 40 mg by mouth daily.    [provider]  simvastatin (ZOCOR) 20 MG tablet Take 20 mg by mouth daily. 05/30/21   [provider]  terbinafine (LAMISIL) 250 MG tablet Take 250 mg by mouth daily. Patient not taking: Reported on 09/20/2022 06/15/21   [provider]  traMADol (ULTRAM) 50 MG tablet Take by mouth every 6 (six) hours as needed.    [provider]    Allergies as of 08/11/2022   (No Known Allergies)    Family History  Problem Relation Age of Onset   Breast cancer Mother 87       x2    Social History   Socioeconomic History   Marital status: Married    Spouse name: Not on file   Number of children: Not on file   Years of education: Not on file   Highest education level: Not on file  Occupational History   Not on file  Tobacco Use   Smoking status: Never   Smokeless tobacco: Never  Substance and Sexual Activity   Alcohol use: Never   Drug use: Never   Sexual activity: Not on  file  Other Topics Concern   Not on file  Social History Narrative   Not on file   Social Determinants of Health   Financial Resource Strain: Not on file  Food Insecurity: Not on file  Transportation Needs: Not on file  Physical Activity: Not on file  Stress: Not on file  Social Connections: Not on file  Intimate Partner Violence: Not on file    Review of Systems: See HPI, otherwise negative ROS  Physical Exam: Ht 5\' 5"  (1.651 m)   Wt 77.1 kg   BMI 28.29 kg/m  General:   Alert,  pleasant and cooperative in NAD Head:  Normocephalic and atraumatic. Neck:  Supple; no masses or  thyromegaly. Lungs:  Clear throughout to auscultation.    Heart:  Regular rate and rhythm. Abdomen:  Soft, nontender and nondistended. Normal bowel sounds, without guarding, and without rebound.   Neurologic:  Alert and  oriented x4;  grossly normal neurologically.  Impression/Plan: Jasmine Robertson is here for an colonoscopy to be performed for positive cologard test and gerd  Risks, benefits, limitations, and alternatives regarding  colonoscopy and upper endoscopy have been reviewed with the patient.  Questions have been answered.  All parties agreeable.   Lucilla Lame, MD  09/28/2022, 7:11 AM

## 2022-11-05 ENCOUNTER — Ambulatory Visit
Admission: EM | Admit: 2022-11-05 | Discharge: 2022-11-05 | Disposition: A | Discharge status: Home / Self Care | Payer: Medicare HMO | Attending: Family Medicine | Admitting: Family Medicine

## 2022-11-05 DIAGNOSIS — J209 Acute bronchitis, unspecified: Secondary | ICD-10-CM | POA: Diagnosis not present

## 2022-11-05 MED ORDER — DOXYCYCLINE HYCLATE 100 MG PO TABS: 100.0000 mg | ORAL_TABLET | Freq: Two times a day (BID) | ORAL | 0 refills | Status: DC

## 2022-11-05 MED ORDER — PROMETHAZINE-DM 6.25-15 MG/5ML PO SYRP: 5.0000 mL | ORAL_SOLUTION | Freq: Four times a day (QID) | ORAL | 0 refills | Status: DC | PRN

## 2022-11-05 NOTE — ED Provider Notes (Signed)
[facility]    CSN: [id number] Arrival date & time: 11/05/22  0802      History   Chief Complaint Chief Complaint  Patient presents with   Nasal Congestion   HPI  71 year old female presents for evaluation of the above.  Patient reports that she has had symptoms for the past 7 days.  Reports croupy cough.  Nonproductive.  No fever.  No shortness of breath.  Also reports nasal congestion.  No reported sick contacts.  She has taken Mucinex without relief.  She states that the Mucinex has made her nauseated.  No other associated symptoms.  No other complaints or concerns at this time.  Past Medical History:  Diagnosis Date   GERD (gastroesophageal reflux disease)    Hypothyroidism    Past Surgical History:  Procedure Laterality Date   BLADDER SURGERY      OB History   No obstetric history on file.      Home Medications    Prior to Admission medications   Medication Sig Start Date End Date Taking? Authorizing Provider  doxycycline (VIBRA-TABS) 100 MG tablet Take 1 tablet (100 mg total) by mouth 2 (two) times daily. 11/05/22  Yes Tommie Sams, DO  levothyroxine (SYNTHROID) 50 MCG tablet Take by mouth. 06/19/18  Yes [provider]  metoprolol succinate (TOPROL-XL) 25 MG 24 hr tablet Take by mouth. 06/05/21  Yes [provider]  pantoprazole (PROTONIX) 40 MG tablet Take 40 mg by mouth daily.   Yes [provider]  promethazine-dextromethorphan (PROMETHAZINE-DM) 6.25-15 MG/5ML syrup Take 5 mLs by mouth 4 (four) times daily as needed for cough. 11/05/22  Yes Danile Trier G, DO  simvastatin (ZOCOR) 20 MG tablet Take 20 mg by mouth daily. 05/30/21  Yes [provider]    Family History Family History  Problem Relation Age of Onset   Breast cancer Mother 19       x2    Social History Social History   Tobacco Use   Smoking status: Never   Smokeless tobacco: Never  Vaping Use   Vaping Use: Never used  Substance Use Topics    Alcohol use: Never   Drug use: Never     Allergies   Aspirin   Review of Systems Review of Systems Per HPI  Physical Exam Triage Vital Signs ED Triage Vitals  Enc Vitals Group     BP 11/05/22 0816 (!) 168/84     Pulse Rate 11/05/22 0816 80     Resp 11/05/22 0816 18     Temp 11/05/22 0816 98.6 F (37 C)     Temp Source 11/05/22 0816 Oral     SpO2 11/05/22 0816 96 %     Weight 11/05/22 0814 168 lb (76.2 kg)     Height 11/05/22 0814 5\' 5"  (1.651 m)     Head Circumference --      Peak Flow --      Pain Score 11/05/22 0814 0     Pain Loc --      Pain Edu? --      Excl. in [place]? --    Updated Vital Signs BP (!) 168/84 (BP Location: Left Arm)   Pulse 80   Temp 98.6 F (37 C) (Oral)   Resp 18   Ht 5\' 5"  (1.651 m)   Wt 76.2 kg   SpO2 96%   BMI 27.96 kg/m   Visual Acuity Right Eye Distance:   Left Eye Distance:   Bilateral  Distance:    Right Eye Near:   Left Eye Near:    Bilateral Near:     Physical Exam Vitals and nursing note reviewed.  Constitutional:      General: She is not in acute distress.    Appearance: Normal appearance.  HENT:     Head: Normocephalic and atraumatic.     Mouth/Throat:     Pharynx: Oropharynx is clear.  Eyes:     General:        Right eye: No discharge.        Left eye: No discharge.     Conjunctiva/sclera: Conjunctivae normal.  Cardiovascular:     Rate and Rhythm: Normal rate and regular rhythm.  Pulmonary:     Effort: Pulmonary effort is normal.     Breath sounds: No wheezing, rhonchi or rales.  Neurological:     Mental Status: She is alert.  Psychiatric:        Mood and Affect: Mood normal.        Behavior: Behavior normal.    UC Treatments / Results  Labs (all labs ordered are listed, but only abnormal results are displayed) Labs Reviewed - No data to display  EKG   Radiology No results found.  Procedures Procedures (including critical care time)  Medications Ordered in UC Medications - No data to  display  Initial Impression / Assessment and Plan / UC Course  I have reviewed the triage vital signs and the nursing notes.  Pertinent labs & imaging results that were available during my care of the patient were reviewed by me and considered in my medical decision making (see chart for details).    70 year old female presents with acute bronchitis. Treating with Doxycycline and Promethazine-DM.  Final Clinical Impressions(s) / UC Diagnoses   Final diagnoses:  Acute bronchitis, unspecified organism   Discharge Instructions   None    ED Prescriptions     Medication Sig Dispense Auth. Provider   doxycycline (VIBRA-TABS) 100 MG tablet Take 1 tablet (100 mg total) by mouth 2 (two) times daily. 14 tablet Jocelin Schuelke G, DO   promethazine-dextromethorphan (PROMETHAZINE-DM) 6.25-15 MG/5ML syrup Take 5 mLs by mouth 4 (four) times daily as needed for cough. 118 mL Tommie Sams, DO      PDMP not reviewed this encounter.  Everlene Other DO [facility]    [place], [place], [place] 11/05/22 [phone]

## 2022-11-05 NOTE — ED Triage Notes (Signed)
Pt c/o chest congestion, headaches x1week  Pt denies ear pain, neck pain, nausea, dizziness, or vomiting.

## 2022-11-09 ENCOUNTER — Ambulatory Visit: Payer: Medicare HMO | Attending: Cardiology | Admitting: Cardiology

## 2022-11-09 ENCOUNTER — Encounter: Payer: Self-pay | Admitting: Cardiology

## 2022-11-09 VITALS — BP 170/76 | HR 70 | Ht 65.0 in | Wt 172.2 lb

## 2022-11-09 DIAGNOSIS — I10 Essential (primary) hypertension: Secondary | ICD-10-CM | POA: Diagnosis not present

## 2022-11-09 DIAGNOSIS — Z01810 Encounter for preprocedural cardiovascular examination: Secondary | ICD-10-CM | POA: Diagnosis not present

## 2022-11-09 NOTE — Progress Notes (Signed)
Cardiology Office Note:    Date:  11/09/2022   ID:  Jasmine Robertson, DOB [birth year], MRN [id number]  PCP:  Alan Mulder, MD   [facility] Providers Cardiologist:  None     Referring MD: Alan Mulder, MD   Chief Complaint  Patient presents with   New Patient (Initial Visit)    Patient was to have a colonoscopy and was told she has an abnormal EKG. Patient denies chest pain or shortness of breath. Medications reviewed by the patient verbally.      History of Present Illness:    Jasmine Robertson is a 71 y.o. female with a hx of hypothyroidism, GERD who presents due to abnormal EKG.  Screening colonoscopy will be planned, EKG preprocedure noted to be abnormal.  EKG obtained 09/28/2022 showed sinus tachycardia heart rate 111, occasional PVCs.  Patient denies chest pain or shortness of breath.  Denies any history of heart disease.  Denies smoking.  Her blood pressures were elevated at recent outpatient visit.  Systolic was in the 170s.  Currently takes Toprol-XL for BP control.  Past Medical History:  Diagnosis Date   GERD (gastroesophageal reflux disease)    Hypothyroidism     Past Surgical History:  Procedure Laterality Date   BLADDER SURGERY      Current Medications: Current Meds  Medication Sig   ALLEGRA-D ALLERGY & CONGESTION 60-120 MG 12 hr tablet Take 1 tablet by mouth 2 (two) times daily.   doxycycline (VIBRA-TABS) 100 MG tablet Take 1 tablet (100 mg total) by mouth 2 (two) times daily.   fluticasone (FLONASE) 50 MCG/ACT nasal spray fluticasone propionate 50 mcg/actuation nasal spray,suspension   levothyroxine (SYNTHROID) 50 MCG tablet Take 50 mcg by mouth daily before breakfast.   metoprolol succinate (TOPROL-XL) 25 MG 24 hr tablet Take 12.5 mg by mouth daily.   Omega-3 Fatty Acids (FISH OIL) 300 MG CAPS Fish Oil   pantoprazole (PROTONIX) 40 MG tablet Take 40 mg by mouth daily.   promethazine-dextromethorphan (PROMETHAZINE-DM) 6.25-15 MG/5ML syrup  Take 5 mLs by mouth 4 (four) times daily as needed for cough.   simvastatin (ZOCOR) 20 MG tablet Take 20 mg by mouth daily.   Vitamin D, Ergocalciferol, (DRISDOL) 1.25 MG (50000 UNIT) CAPS capsule Take 50,000 Units by mouth once a week.     Allergies:   Aspirin   Social History   Socioeconomic History   Marital status: Married    Spouse name: Not on file   Number of children: Not on file   Years of education: Not on file   Highest education level: Not on file  Occupational History   Not on file  Tobacco Use   Smoking status: Never   Smokeless tobacco: Never  Vaping Use   Vaping Use: Never used  Substance and Sexual Activity   Alcohol use: Never   Drug use: Never   Sexual activity: Not on file  Other Topics Concern   Not on file  Social History Narrative   Not on file   Social Determinants of Health   Financial Resource Strain: Not on file  Food Insecurity: Not on file  Transportation Needs: Not on file  Physical Activity: Not on file  Stress: Not on file  Social Connections: Not on file     Family History: The patient's family history includes Breast cancer (age of onset: 26) in her mother.  ROS:   Please see the history of present illness.     All other systems  reviewed and are negative.  EKGs/Labs/Other Studies Reviewed:    The following studies were reviewed today:   EKG:  EKG is  ordered today.  The ekg ordered today demonstrates sinus rhythm, possible left atrial enlargement  Recent Labs: No results found for requested labs within last 365 days.  Recent Lipid Panel No results found for: "CHOL", "TRIG", "HDL", "CHOLHDL", "VLDL", "LDLCALC", "LDLDIRECT"   Risk Assessment/Calculations:     HYPERTENSION CONTROL Vitals:   11/09/22 1136 11/09/22 1141  BP: (!) 170/80 (!) 170/76    The patient's blood pressure is elevated above target today.  In order to address the patient's elevated BP: Follow up with primary care provider for management.          Physical Exam:    VS:  BP (!) 170/76 (BP Location: Right Arm, Patient Position: Sitting, Cuff Size: Normal)   Pulse 70   Ht 5\' 5"  (1.651 m)   Wt 172 lb 4 oz (78.1 kg)   SpO2 97%   BMI 28.66 kg/m     Wt Readings from Last 3 Encounters:  11/09/22 172 lb 4 oz (78.1 kg)  11/05/22 168 lb (76.2 kg)  09/28/22 165 lb (74.8 kg)     GEN:  Well nourished, well developed in no acute distress HEENT: Normal NECK: No JVD; No carotid bruits CARDIAC: RRR, no murmurs, rubs, gallops RESPIRATORY:  Clear to auscultation without rales, wheezing or rhonchi  ABDOMEN: Soft, non-tender, non-distended MUSCULOSKELETAL:  No edema; No deformity  SKIN: Warm and dry NEUROLOGIC:  Alert and oriented x 3 PSYCHIATRIC:  Normal affect   ASSESSMENT:    1. Pre-procedural cardiovascular examination   2. Primary hypertension    PLAN:    In order of problems listed above:  Preop cardiovascular exam, colonoscopy being planned, colonoscopies are deemed low risk from a cardiac perspective.  Patient asymptomatic with no chest pain or shortness of breath.  EKG today shows no PVCs.  Okay to proceed with colonoscopy from a cardiac perspective.  RCRI 0. Hypertension, BP elevated, continue Toprol-XL.  Additional medication for adequate blood pressure control recommended, patient wants to follow-up with PCP regarding this.  Follow-up as needed      Medication Adjustments/Labs and Tests Ordered: Current medicines are reviewed at length with the patient today.  Concerns regarding medicines are outlined above.  No orders of the defined types were placed in this encounter.  No orders of the defined types were placed in this encounter.   There are no Patient Instructions on file for this visit.   Signed, Kate Sable, MD  11/09/2022 12:14 PM    [place]

## 2023-01-28 ENCOUNTER — Ambulatory Visit
Admission: EM | Admit: 2023-01-28 | Discharge: 2023-01-28 | Disposition: A | Discharge status: Home / Self Care | Payer: Medicare HMO

## 2023-01-28 ENCOUNTER — Encounter: Payer: Self-pay | Admitting: Emergency Medicine

## 2023-01-28 DIAGNOSIS — I10 Essential (primary) hypertension: Secondary | ICD-10-CM | POA: Diagnosis not present

## 2023-01-28 DIAGNOSIS — R42 Dizziness and giddiness: Secondary | ICD-10-CM | POA: Diagnosis not present

## 2023-01-28 HISTORY — DX: Essential (primary) hypertension: I10

## 2023-01-28 MED ORDER — MECLIZINE HCL 12.5 MG PO TABS: 12.5000 mg | ORAL_TABLET | Freq: Three times a day (TID) | ORAL | 0 refills | Status: AC | PRN

## 2023-01-28 NOTE — ED Provider Notes (Signed)
[facility]    CSN: [id number] Arrival date & time: 01/28/23  1124      History   Chief Complaint Chief Complaint  Patient presents with   Hypertension    HPI Jasmine Robertson is a 71 y.o. female.   Patient presents for evaluation of 4 to 5 days of dizziness described as feeling unbalanced, symptoms worsened this morning prompting her to check blood pressure which was elevated at 161/81.  Has associated blurred vision only during times of dizziness.  Has not attempted treatment.  Denies changes in activity, stress levels or diet, endorses following a low-salt diet as directed.  History of hypertension, taking daily medication as prescribed without missing dosages.  Denies nausea, vomiting, abdominal pain, chest pain, shortness of breath, lightheadedness, syncope, memory or speech changes or weakness.  Denies head injury or trauma.  Denies use of blood thinners.  Past Medical History:  Diagnosis Date   GERD (gastroesophageal reflux disease)    Hypertension    Hypothyroidism     There are no problems to display for this patient.   Past Surgical History:  Procedure Laterality Date   BLADDER SURGERY      OB History   No obstetric history on file.      Home Medications    Prior to Admission medications   Medication Sig Start Date End Date Taking? Authorizing Provider  aspirin EC 81 MG tablet Take 81 mg by mouth daily. Swallow whole.   Yes [provider]  metoprolol succinate (TOPROL-XL) 25 MG 24 hr tablet Take 12.5 mg by mouth daily. 06/05/21  Yes [provider]  pantoprazole (PROTONIX) 40 MG tablet Take 40 mg by mouth daily.   Yes [provider]  simvastatin (ZOCOR) 20 MG tablet Take 20 mg by mouth daily. 05/30/21  Yes [provider]  Vitamin D, Ergocalciferol, (DRISDOL) 1.25 MG (50000 UNIT) CAPS capsule Take 50,000 Units by mouth once a week. 06/09/22  Yes [provider]  ALLEGRA-D ALLERGY & CONGESTION 60-120 MG  12 hr tablet Take 1 tablet by mouth 2 (two) times daily. 05/18/22   [provider]  doxycycline (VIBRA-TABS) 100 MG tablet Take 1 tablet (100 mg total) by mouth 2 (two) times daily. 11/05/22   Coral Spikes, DO  fluticasone (FLONASE) 50 MCG/ACT nasal spray fluticasone propionate 50 mcg/actuation nasal spray,suspension 06/19/18   [provider]  levothyroxine (SYNTHROID) 50 MCG tablet Take 50 mcg by mouth daily before breakfast. 06/19/18   [provider]  Omega-3 Fatty Acids (FISH OIL) 300 MG CAPS Fish Oil    [provider]  promethazine-dextromethorphan (PROMETHAZINE-DM) 6.25-15 MG/5ML syrup Take 5 mLs by mouth 4 (four) times daily as needed for cough. 11/05/22   Coral Spikes, DO  Vitamin E (VITAMIN E/D-ALPHA NATURAL) 268 MG (400 UNIT) CAPS Take by mouth.    [provider]    Family History Family History  Problem Relation Age of Onset   Breast cancer Mother 42       x2    Social History Social History   Tobacco Use   Smoking status: Never   Smokeless tobacco: Never  Vaping Use   Vaping Use: Never used  Substance Use Topics   Alcohol use: Never   Drug use: Never     Allergies   Aspirin   Review of Systems Review of Systems Defer to HPI    Physical Exam Triage Vital Signs ED Triage Vitals  Enc Vitals Group  BP 01/28/23 1139 (!) 160/63     Pulse Rate 01/28/23 1139 61     Resp 01/28/23 1139 14     Temp 01/28/23 1139 98 F (36.7 C)     Temp Source 01/28/23 1139 Jasmine     SpO2 01/28/23 1139 99 %     Weight 01/28/23 1136 175 lb (79.4 kg)     Height 01/28/23 1136 5\' 5"  (1.651 m)     Head Circumference --      Peak Flow --      Pain Score 01/28/23 1136 0     Pain Loc --      Pain Edu? --      Excl. in [place]? --    No data found.  Updated Vital Signs BP (!) 160/63 (BP Location: Left Arm)   Pulse 61   Temp 98 F (36.7 C) (Jasmine)   Resp 14   Ht 5\' 5"  (1.651 m)   Wt 175 lb (79.4 kg)   SpO2 99%   BMI 29.12 kg/m    Visual Acuity Right Eye Distance:   Left Eye Distance:   Bilateral Distance:    Right Eye Near:   Left Eye Near:    Bilateral Near:     Physical Exam Constitutional:      Appearance: Normal appearance.  HENT:     Head: Normocephalic.     Right Ear: Tympanic membrane, ear canal and external ear normal.     Left Ear: Tympanic membrane and ear canal normal.  Eyes:     Extraocular Movements: Extraocular movements intact.     Conjunctiva/sclera: Conjunctivae normal.     Pupils: Pupils are equal, round, and reactive to light.  Cardiovascular:     Rate and Rhythm: Normal rate and regular rhythm.     Pulses: Normal pulses.     Heart sounds: Normal heart sounds.  Pulmonary:     Effort: Pulmonary effort is normal.     Breath sounds: Normal breath sounds.  Neurological:     General: No focal deficit present.     Mental Status: She is alert and oriented to person, place, and time. Mental status is at baseline.     Cranial Nerves: No cranial nerve deficit.     Sensory: No sensory deficit.     Motor: No weakness.     Gait: Gait normal.      UC Treatments / Results  Labs (all labs ordered are listed, but only abnormal results are displayed) Labs Reviewed - No data to display  EKG   Radiology No results found.  Procedures Procedures (including critical care time)  Medications Ordered in UC Medications - No data to display  Initial Impression / Assessment and Plan / UC Course  I have reviewed the triage vital signs and the nursing notes.  Pertinent labs & imaging results that were available during my care of the patient were reviewed by me and considered in my medical decision making (see chart for details).  Elevated blood pressure reading in office with diagnosis of hypertension, Dizziness  Vital signs are stable, blood pressure in triage 160/63, nonemergent, EKG showing normal sinus rhythm, no abnormalities neurologically, no abnormalities to the ear, discussed all  findings with patient, and as patient has been well-controlled with current blood pressure regimen will not make changes based on 1 abnormal reading, advised to monitor for the next 1 to 2 weeks and record, advise follow-up with PCP or cardiologist within the next 1 to 2 weeks for  reevaluation of symptoms, advised to continue low-salt diet, meclizine prescribed for management of dizziness and advised changing positions slowly as well as using self to the floor to prevent injury while dizziness is present, given strict precautions for worsening symptoms she is to go to the nearest emergency department for immediate workup Final Clinical Impressions(s) / UC Diagnoses   Final diagnoses:  Elevated blood pressure reading in office with diagnosis of hypertension     Discharge Instructions      Your blood pressure in the office is 160/63, while elevated this is a nonemergent level  At this time did not want to make changes to your medications due to 1 day of elevation,  please check blood pressure daily at home and record, take when you are in a calm quiet environment  Ensure that you are eating a low-salt diet to ensure this is not contributing to your readings  Please schedule follow-up appointment with your cardiologist in 1 week for reevaluation of your symptoms, take recorded blood pressure readings with you  At any point if you begin to have dizziness, lightheadedness, visual changes, chest pain, shortness of breath, abdominal pain or vomiting and your blood pressure is elevated please go to the nearest emergency department for immediate evaluation and workup   ED Prescriptions   None    PDMP not reviewed this encounter.   Hans [place], [place] 01/28/23 1217

## 2023-01-28 NOTE — ED Triage Notes (Signed)
Patient states that when she woke up this morning "her head felt swimmy."  Patient denies any pain.  Patient states that her BP was elevated 164/67.  Patient states that she has taken her BP medicine this morning.

## 2023-01-28 NOTE — Discharge Instructions (Addendum)
Your blood pressure in the office is 160/63, while elevated this is a nonemergent level  Your EKG shows that your heart is beating in a normal pace and rhythm  At this time did not want to make changes to your medications due to 1 day of elevation,  please check blood pressure daily at home and record, take when you are in a calm quiet environment  Use meclizine every 8 hours as needed to help with your dizziness  When dizziness is present lowered yourself to a seated position and pull over if you are driving to prevent falls and for your safety  When dizziness is present change positions slowly waiting at least 10 seconds between movements to help stabilize the body  On exam there are no abnormalities to the ear that may be contributing to your symptoms  There are no abnormalities to your neurological exam  Ensure that you are eating a low-salt diet to ensure this is not contributing to your readings  Please schedule follow-up appointment with your cardiologist in 1 week for reevaluation of your symptoms, take recorded blood pressure readings with you  At any point if you begin to have lightheadedness, visual changes, chest pain, shortness of breath, abdominal pain or vomiting and your blood pressure is elevated please go to the nearest emergency department for immediate evaluation and workup

## 2023-04-03 ENCOUNTER — Telehealth: Payer: Self-pay | Admitting: Gastroenterology

## 2023-04-03 ENCOUNTER — Other Ambulatory Visit: Payer: Self-pay | Admitting: *Deleted

## 2023-04-03 DIAGNOSIS — K219 Gastro-esophageal reflux disease without esophagitis: Secondary | ICD-10-CM

## 2023-04-03 DIAGNOSIS — R195 Other fecal abnormalities: Secondary | ICD-10-CM

## 2023-04-03 MED ORDER — NA SULFATE-K SULFATE-MG SULF 17.5-3.13-1.6 GM/177ML PO SOLN: 1.0000 | Freq: Once | ORAL | 0 refills | Status: AC

## 2023-04-03 NOTE — Addendum Note (Signed)
Addended by: Tawnya Crook on: 04/03/2023 04:44 PM   Modules accepted: Orders

## 2023-04-03 NOTE — Telephone Encounter (Signed)
Yes Jasmine Robertson,  I scheduled Jasmine Robertson colonoscopy in August with Dr. Servando Snare and she requested to add an EGD.  Dr.Wohl okayed to have EGD added to patients colonoscopy because she said she was having really bad acid reflux.  Per his note on 08/11/22.  Thanks, [place], [place]

## 2023-04-03 NOTE — Telephone Encounter (Signed)
Pt left message to schedule colonoscopy with Dr. Servando Snare

## 2023-04-03 NOTE — Telephone Encounter (Signed)
Spoken to patient and we are scheduling the EGD and colonoscopy on 04/26/2023.  Will send procedure clearance to Cardiology.  New instructions will be sent and prep solution sent to [facility].   Patient verbalized understanding.

## 2023-04-03 NOTE — Telephone Encounter (Signed)
Hey, do you remember this patient? I was looking at the patient's charts and show that she was schedule for colonoscopy and EGD.  Do remember why EGD was added?

## 2023-04-05 ENCOUNTER — Telehealth: Payer: Self-pay | Admitting: Cardiology

## 2023-04-05 ENCOUNTER — Telehealth: Payer: Self-pay | Admitting: *Deleted

## 2023-04-05 NOTE — Telephone Encounter (Signed)
Pt has been scheduled for tele pre op appt 04/16/23 @ 9 am. Med rec and consent are done.

## 2023-04-05 NOTE — Telephone Encounter (Signed)
   Name: Jasmine Robertson  DOB: 1951-05-29  MRN: [id number]  Primary Cardiologist: None   Preoperative team, please contact this patient and set up a phone call appointment for further preoperative risk assessment. Please obtain consent and complete medication review. Thank you for your help.  I confirm that guidance regarding antiplatelet and oral anticoagulation therapy has been completed and, if necessary, noted below.  None requested.    Carlos Levering, NP 04/05/2023, 11:49 AM [facility]

## 2023-04-05 NOTE — Telephone Encounter (Signed)
   Pre-operative Risk Assessment    Patient Name: Jasmine Robertson  DOB: 10/23/1951 MRN: [id number]      Request for Surgical Clearance    Procedure:   COLONOSCOPY  gery:  Clearance 04/26/23                               Surgeon:  NOT INDICATED Surgeon's Group or Practice Name:  [facility] Phone number:  [phone] Fax number:  [phone]  Type of Clearance Requested:   - Medical    Type of Anesthesia:  Not Indicated   Additional requests/questions:    Signed, Dalia Heading   04/05/2023, 8:22 AM

## 2023-04-05 NOTE — Telephone Encounter (Signed)
Pt has been scheduled for tele pre op appt 04/16/23 @ 9 am. Med rec and consent are done.     Patient Consent for Virtual Visit        Jasmine Robertson has provided verbal consent on 04/05/2023 for a virtual visit (video or telephone).   CONSENT FOR VIRTUAL VISIT FOR:  Jasmine Robertson  By participating in this virtual visit I agree to the following:  I hereby voluntarily request, consent and authorize [facility] and its employed or contracted physicians, physician assistants, nurse practitioners or other licensed health care professionals (the Practitioner), to provide me with telemedicine health care services (the "Services") as deemed necessary by the treating Practitioner. I acknowledge and consent to receive the Services by the Practitioner via telemedicine. I understand that the telemedicine visit will involve communicating with the Practitioner through live audiovisual communication technology and the disclosure of certain medical information by electronic transmission. I acknowledge that I have been given the opportunity to request an in-person assessment or other available alternative prior to the telemedicine visit and am voluntarily participating in the telemedicine visit.  I understand that I have the right to withhold or withdraw my consent to the use of telemedicine in the course of my care at any time, without affecting my right to future care or treatment, and that the Practitioner or I may terminate the telemedicine visit at any time. I understand that I have the right to inspect all information obtained and/or recorded in the course of the telemedicine visit and may receive copies of available information for a reasonable fee.  I understand that some of the potential risks of receiving the Services via telemedicine include:  Delay or interruption in medical evaluation due to technological equipment failure or disruption; Information transmitted may not be sufficient (e.g.  poor resolution of images) to allow for appropriate medical decision making by the Practitioner; and/or  In rare instances, security protocols could fail, causing a breach of personal health information.  Furthermore, I acknowledge that it is my responsibility to provide information about my medical history, conditions and care that is complete and accurate to the best of my ability. I acknowledge that Practitioner's advice, recommendations, and/or decision may be based on factors not within their control, such as incomplete or inaccurate data provided by me or distortions of diagnostic images or specimens that may result from electronic transmissions. I understand that the practice of medicine is not an exact science and that Practitioner makes no warranties or guarantees regarding treatment outcomes. I acknowledge that a copy of this consent can be made available to me via my patient portal [facility] MyChart), or I can request a printed copy by calling the office of [facility].    I understand that my insurance will be billed for this visit.   I have read or had this consent read to me. I understand the contents of this consent, which adequately explains the benefits and risks of the Services being provided via telemedicine.  I have been provided ample opportunity to ask questions regarding this consent and the Services and have had my questions answered to my satisfaction. I give my informed consent for the services to be provided through the use of telemedicine in my medical care

## 2023-04-13 NOTE — Progress Notes (Unsigned)
Virtual Visit via Telephone Note   Because of Jasmine Robertson's co-morbid illnesses, she is at least at moderate risk for complications without adequate follow up.  This format is felt to be most appropriate for this patient at this time.  The patient did not have access to video technology/had technical difficulties with video requiring transitioning to audio format only (telephone).  All issues noted in this document were discussed and addressed.  No physical exam could be performed with this format.  Please refer to the patient's chart for her consent to telehealth for [facility].  Evaluation Performed:  Preoperative cardiovascular risk assessment _____________   Date:  04/16/2023   Patient ID:  Jasmine Robertson, DOB [birth year], MRN [id number] Patient Location:  Home Provider location:   Office  Primary Care Provider:  Alan Mulder, MD Primary Cardiologist:  Debbe Odea, MD  Chief Complaint / Patient Profile   72 y.o. y/o female with a h/o hypothyroidism, HTN, GERD, abnormal EKG obtained 09/28/2022 which showed sinus tachycardia at 111 bpm, occasional PVCs.  Follow-up EKG on 11/09/2022 revealed sinus rhythm with possible left atrial enlargement. She is pending colonoscopy and presents today for telephonic preoperative cardiovascular risk assessment.  History of Present Illness    Jasmine Robertson is a 72 y.o. female who presents via audio/video conferencing for a telehealth visit today.  Pt was last seen in cardiology clinic on 11/09/22 by Dr. Azucena Cecil.  At that time Jasmine Robertson was doing well.  The patient is now pending procedure as outlined above. Since her last visit, she denies chest pain, shortness of breath, lower extremity edema, fatigue, palpitations, melena, hematuria, hemoptysis, diaphoresis, weakness, presyncope, syncope, orthopnea, and PND.   Past Medical History    Past Medical History:  Diagnosis Date   GERD (gastroesophageal reflux  disease)    Hypertension    Hypothyroidism    Past Surgical History:  Procedure Laterality Date   BLADDER SURGERY      Allergies  Allergies  Allergen Reactions   Aspirin Other (See Comments)    Inflammation    Home Medications    Prior to Admission medications   Medication Sig Start Date End Date Taking? Authorizing Provider  alendronate (FOSAMAX) 70 MG tablet Take 70 mg by mouth once a week. 03/21/23   [provider]  ALLEGRA-D ALLERGY & CONGESTION 60-120 MG 12 hr tablet Take 1 tablet by mouth 2 (two) times daily. 05/18/22   [provider]  aspirin EC 81 MG tablet Take 81 mg by mouth daily. Swallow whole.    [provider]  doxycycline (VIBRA-TABS) 100 MG tablet Take 1 tablet (100 mg total) by mouth 2 (two) times daily. Patient not taking: Reported on 04/05/2023 11/05/22   Tommie Sams, DO  fluticasone [facility]) 50 MCG/ACT nasal spray fluticasone propionate 50 mcg/actuation nasal spray,suspension 06/19/18   [provider]  levothyroxine (SYNTHROID) 50 MCG tablet Take 50 mcg by mouth daily before breakfast. 06/19/18   [provider]  meclizine (ANTIVERT) 12.5 MG tablet Take 1 tablet (12.5 mg total) by mouth 3 (three) times daily as needed for dizziness. 01/28/23   Valinda Hoar, NP  metoprolol succinate (TOPROL-XL) 25 MG 24 hr tablet Take 12.5 mg by mouth daily. 06/05/21   [provider]  Omega-3 Fatty Acids (FISH OIL) 300 MG CAPS Fish Oil    [provider]  pantoprazole (PROTONIX) 40 MG tablet Take 40 mg by mouth daily.    [provider]  promethazine-dextromethorphan (PROMETHAZINE-DM) 6.25-15 MG/5ML syrup Take 5 mLs by mouth 4 (four) times daily as needed for cough. 11/05/22   Tommie Sams, DO  simvastatin (ZOCOR) 20 MG tablet Take 20 mg by mouth daily. 05/30/21   [provider]  Vitamin D, Ergocalciferol, (DRISDOL) 1.25 MG (50000 UNIT) CAPS capsule Take 50,000 Units by mouth once a week. 06/09/22    [provider]  Vitamin E (VITAMIN E/D-ALPHA NATURAL) 268 MG (400 UNIT) CAPS Take by mouth.    [provider]    Physical Exam    Vital Signs:  [name] does not have vital signs available for review today.  Given telephonic nature of communication, physical exam is limited. AAOx3. NAD. Normal affect.  Speech and respirations are unlabored.  Accessory Clinical Findings    None  Assessment & Plan    1.  Preoperative Cardiovascular Risk Assessment: According to the Revised Cardiac Risk Index (RCRI), her Perioperative Risk of Major Cardiac Event is (%): 0.4. Her Functional Capacity in METs is: 7.59 according to the Duke Activity Status Index (DASI). The patient is doing well from a cardiac perspective. Therefore, based on ACC/AHA guidelines, the patient would be at acceptable risk for the planned procedure without further cardiovascular testing.   The patient was advised that if she develops new symptoms prior to surgery to contact our office to arrange for a follow-up visit, and she verbalized understanding.  A copy of this note will be routed to requesting surgeon.  Time:   Today, I have spent 7 minutes with the patient with telehealth technology discussing medical history, symptoms, and management plan.    Levi Aland, NP-C  04/16/2023, 9:01 AM [address] Office [phone] Fax [phone]

## 2023-04-16 ENCOUNTER — Ambulatory Visit: Payer: Medicare HMO | Attending: Cardiology | Admitting: Nurse Practitioner

## 2023-04-16 ENCOUNTER — Encounter: Payer: Self-pay | Admitting: Nurse Practitioner

## 2023-04-16 DIAGNOSIS — Z01810 Encounter for preprocedural cardiovascular examination: Secondary | ICD-10-CM | POA: Diagnosis not present

## 2023-04-18 ENCOUNTER — Telehealth: Payer: Self-pay | Admitting: *Deleted

## 2023-04-18 NOTE — Telephone Encounter (Signed)
Spoken to patient and patient is doing okay. She is aware of information below.

## 2023-04-18 NOTE — Telephone Encounter (Signed)
Per Cardiology on 04/16/2023  Assessment & Plan    1.  Preoperative Cardiovascular Risk Assessment: According to the Revised Cardiac Risk Index (RCRI), her Perioperative Risk of Major Cardiac Event is (%): 0.4. Her Functional Capacity in METs is: 7.59 according to the Duke Activity Status Index (DASI). The patient is doing well from a cardiac perspective. Therefore, based on ACC/AHA guidelines, the patient would be at acceptable risk for the planned procedure without further cardiovascular testing.    The patient was advised that if she develops new symptoms prior to surgery to contact our office to arrange for a follow-up visit, and she verbalized understanding.   A copy of this note will be routed to requesting surgeon.   Time:   Today, I have spent 7 minutes with the patient with telehealth technology discussing medical history, symptoms, and management plan.     Levi Aland, NP-C  04/16/2023, 9:01 AM [address], [address] Office [phone] Fax [phone]

## 2023-04-19 ENCOUNTER — Encounter: Payer: Self-pay | Admitting: Gastroenterology

## 2023-04-26 ENCOUNTER — Ambulatory Visit
Admission: RE | Admit: 2023-04-26 | Discharge: 2023-04-26 | Disposition: A | Discharge status: Home / Self Care | Payer: Medicare HMO | Attending: Gastroenterology | Admitting: Gastroenterology

## 2023-04-26 ENCOUNTER — Other Ambulatory Visit: Payer: Self-pay

## 2023-04-26 ENCOUNTER — Encounter: Payer: Self-pay | Admitting: Gastroenterology

## 2023-04-26 ENCOUNTER — Encounter
Admission: RE | Disposition: A | Discharge status: Home / Self Care | Payer: Self-pay | Source: Home / Self Care | Attending: Gastroenterology

## 2023-04-26 ENCOUNTER — Ambulatory Visit: Payer: Medicare HMO | Admitting: Anesthesiology

## 2023-04-26 DIAGNOSIS — R12 Heartburn: Secondary | ICD-10-CM | POA: Insufficient documentation

## 2023-04-26 DIAGNOSIS — I10 Essential (primary) hypertension: Secondary | ICD-10-CM | POA: Diagnosis not present

## 2023-04-26 DIAGNOSIS — K640 First degree hemorrhoids: Secondary | ICD-10-CM | POA: Insufficient documentation

## 2023-04-26 DIAGNOSIS — K449 Diaphragmatic hernia without obstruction or gangrene: Secondary | ICD-10-CM | POA: Insufficient documentation

## 2023-04-26 DIAGNOSIS — E039 Hypothyroidism, unspecified: Secondary | ICD-10-CM | POA: Insufficient documentation

## 2023-04-26 DIAGNOSIS — Z79899 Other long term (current) drug therapy: Secondary | ICD-10-CM | POA: Insufficient documentation

## 2023-04-26 DIAGNOSIS — Z1211 Encounter for screening for malignant neoplasm of colon: Secondary | ICD-10-CM | POA: Diagnosis present

## 2023-04-26 DIAGNOSIS — K219 Gastro-esophageal reflux disease without esophagitis: Secondary | ICD-10-CM | POA: Diagnosis not present

## 2023-04-26 DIAGNOSIS — Z79890 Hormone replacement therapy (postmenopausal): Secondary | ICD-10-CM | POA: Insufficient documentation

## 2023-04-26 DIAGNOSIS — R195 Other fecal abnormalities: Secondary | ICD-10-CM

## 2023-04-26 HISTORY — PX: COLONOSCOPY WITH PROPOFOL: SHX5780

## 2023-04-26 HISTORY — DX: Dizziness and giddiness: R42

## 2023-04-26 HISTORY — PX: ESOPHAGOGASTRODUODENOSCOPY (EGD) WITH PROPOFOL: SHX5813

## 2023-04-26 SURGERY — COLONOSCOPY WITH PROPOFOL
Anesthesia: General

## 2023-04-26 MED ORDER — LACTATED RINGERS IV SOLN: INTRAVENOUS | Status: DC

## 2023-04-26 MED ORDER — SODIUM CHLORIDE 0.9 % IV SOLN: INTRAVENOUS | Status: DC

## 2023-04-26 MED ORDER — DEXAMETHASONE SODIUM PHOSPHATE 4 MG/ML IJ SOLN
INTRAMUSCULAR | Status: DC | PRN
  Administered 2023-04-26: 4 mg via INTRAVENOUS

## 2023-04-26 MED ORDER — ONDANSETRON HCL 4 MG/2ML IJ SOLN
4.0000 mg | Freq: Once | INTRAMUSCULAR | Status: AC
  Administered 2023-04-26: 4 mg via INTRAVENOUS

## 2023-04-26 MED ORDER — STERILE WATER FOR IRRIGATION IR SOLN
Status: DC | PRN
  Administered 2023-04-26: 1000 mL

## 2023-04-26 MED ORDER — PROPOFOL 10 MG/ML IV BOLUS
INTRAVENOUS | Status: DC | PRN
  Administered 2023-04-26: 40 mg via INTRAVENOUS
  Administered 2023-04-26: 30 mg via INTRAVENOUS
  Administered 2023-04-26 (×2): 40 mg via INTRAVENOUS
  Administered 2023-04-26: 20 mg via INTRAVENOUS
  Administered 2023-04-26: 80 mg via INTRAVENOUS

## 2023-04-26 MED ORDER — LIDOCAINE HCL (CARDIAC) PF 100 MG/5ML IV SOSY
PREFILLED_SYRINGE | INTRAVENOUS | Status: DC | PRN
  Administered 2023-04-26: 50 mg via INTRAVENOUS

## 2023-04-26 SURGICAL SUPPLY — 11 items
BLOCK BITE 60FR ADLT L/F GRN (MISCELLANEOUS) ×1 IMPLANT
FCP ESCP3.2XJMB 240X2.8X (MISCELLANEOUS) ×1
FORCEPS BIOP RAD 4 LRG CAP 4 (CUTTING FORCEPS) IMPLANT
FORCEPS BIOP RJ4 240 W/NDL (MISCELLANEOUS) ×1
FORCEPS ESCP3.2XJMB 240X2.8X (MISCELLANEOUS) IMPLANT
GOWN CVR UNV OPN BCK APRN NK (MISCELLANEOUS) ×2 IMPLANT
GOWN ISOL THUMB LOOP REG UNIV (MISCELLANEOUS) ×2
KIT PRC NS LF DISP ENDO (KITS) ×1 IMPLANT
KIT PROCEDURE OLYMPUS (KITS) ×1
MANIFOLD NEPTUNE II (INSTRUMENTS) ×1 IMPLANT
WATER STERILE IRR 250ML POUR (IV SOLUTION) ×1 IMPLANT

## 2023-04-26 NOTE — Op Note (Signed)
[facility] Gastroenterology Patient Name: Jasmine Robertson Procedure Date: 04/26/2023 8:48 AM MRN: [id number] Account #: [id number] Date of Birth: 11-22-1951 Admit Type: Outpatient Age: 72 Room: [facility] OR ROOM 01 Gender: Female Note Status: Finalized Instrument Name: [id number] Procedure:             Upper GI endoscopy Indications:           Heartburn Providers:             Midge Minium MD, MD Referring MD:          Alan Mulder, MD (Referring MD) Medicines:             Propofol per Anesthesia Complications:         No immediate complications. Procedure:             Pre-Anesthesia Assessment:                        - Prior to the procedure, a History and Physical was                         performed, and patient medications and allergies were                         reviewed. The patient's tolerance of previous                         anesthesia was also reviewed. The risks and benefits                         of the procedure and the sedation options and risks                         were discussed with the patient. All questions were                         answered, and informed consent was obtained. Prior                         Anticoagulants: The patient has taken no anticoagulant                         or antiplatelet agents. ASA Grade Assessment: II - A                         patient with mild systemic disease. After reviewing                         the risks and benefits, the patient was deemed in                         satisfactory condition to undergo the procedure.                        After obtaining informed consent, the endoscope was                         passed under direct vision. Throughout the procedure,  the patient's blood pressure, pulse, and oxygen                         saturations were monitored continuously. The Endoscope                         was introduced through the mouth, and advanced to the                          second part of duodenum. The upper GI endoscopy was                         accomplished without difficulty. The patient tolerated                         the procedure well. Findings:      A small hiatal hernia was present.      The stomach was normal.      The examined duodenum was normal. Biopsies were taken with a cold       forceps for histology. Impression:            - Small hiatal hernia.                        - Normal stomach.                        - Normal examined duodenum. Biopsied. Recommendation:        - Discharge patient to home.                        - Resume previous diet.                        - Continue present medications.                        - Await pathology results.                        - Perform a colonoscopy today. Procedure Code(s):     --- Professional ---                        [phone], Esophagogastroduodenoscopy, flexible,                         transoral; with biopsy, single or multiple Diagnosis Code(s):     --- Professional ---                        R12, Heartburn CPT copyright 2022 American Medical Association. All rights reserved. The codes documented in this report are preliminary and upon coder review may  be revised to meet current compliance requirements. Midge Minium MD, MD 04/26/2023 8:59:42 AM This report has been signed electronically. Number of Addenda: 0 Note Initiated On: 04/26/2023 8:48 AM Total Procedure Duration: 0 hours 3 minutes 25 seconds  Estimated Blood Loss:  Estimated blood loss: none.      [facility]

## 2023-04-26 NOTE — Anesthesia Postprocedure Evaluation (Signed)
Anesthesia Post Note  Patient: Novella Abraha Jaffer  Procedure(s) Performed: COLONOSCOPY WITH PROPOFOL ESOPHAGOGASTRODUODENOSCOPY (EGD) WITH PROPOFOL with polypectomy  Patient location during evaluation: PACU Anesthesia Type: General Level of consciousness: awake and alert Pain management: pain level controlled Vital Signs Assessment: post-procedure vital signs reviewed and stable Respiratory status: spontaneous breathing, nonlabored ventilation, respiratory function stable and patient connected to nasal cannula oxygen Cardiovascular status: blood pressure returned to baseline and stable Postop Assessment: no apparent nausea or vomiting Anesthetic complications: no   No notable events documented.   Last Vitals:  Vitals:   04/26/23 0920 04/26/23 0925  BP:  113/66  Pulse: 81 69  Resp: 19 18  Temp:    SpO2: 94% 100%    Last Pain:  Vitals:   04/26/23 0920  TempSrc:   PainSc: 0-No pain                 Aria Jarrard C Armando Bukhari

## 2023-04-26 NOTE — Transfer of Care (Signed)
Immediate Anesthesia Transfer of Care Note  Patient: Jasmine Robertson  Procedure(s) Performed: COLONOSCOPY WITH PROPOFOL ESOPHAGOGASTRODUODENOSCOPY (EGD) WITH PROPOFOL with polypectomy  Patient Location: PACU  Anesthesia Type: General  Level of Consciousness: awake, alert  and patient cooperative  Airway and Oxygen Therapy: Patient Spontanous Breathing and Patient connected to supplemental oxygen  Post-op Assessment: Post-op Vital signs reviewed, Patient's Cardiovascular Status Stable, Respiratory Function Stable, Patent Airway and No signs of Nausea or vomiting  Post-op Vital Signs: Reviewed and stable  Complications: No notable events documented.

## 2023-04-26 NOTE — Anesthesia Preprocedure Evaluation (Signed)
Anesthesia Evaluation  Patient identified by MRN, date of birth, ID band Patient awake    Reviewed: Allergy & Precautions, H&P , NPO status , Patient's Chart, lab work & pertinent test results  Airway Mallampati: I  TM Distance: >3 FB Neck ROM: Full    Dental no notable dental hx.    Pulmonary neg pulmonary ROS   Pulmonary exam normal breath sounds clear to auscultation       Cardiovascular hypertension, negative cardio ROS Normal cardiovascular exam Rhythm:Regular Rate:Normal  Hx bradycardia, "irregular heartbeat the last time I was here," but is regular today, and no bradycardia at this time of physical evaluation   Neuro/Psych negative neurological ROS  negative psych ROS   GI/Hepatic Neg liver ROS,GERD  ,,  Endo/Other  Hypothyroidism    Renal/GU negative Renal ROS  negative genitourinary   Musculoskeletal negative musculoskeletal ROS (+)    Abdominal   Peds negative pediatric ROS (+)  Hematology negative hematology ROS (+)   Anesthesia Other Findings   Reproductive/Obstetrics negative OB ROS                             Anesthesia Physical Anesthesia Plan  ASA: 2  Anesthesia Plan: General   Post-op Pain Management:    Induction: Intravenous  PONV Risk Score and Plan:   Airway Management Planned: Natural Airway and Nasal Cannula  Additional Equipment:   Intra-op Plan:   Post-operative Plan:   Informed Consent: I have reviewed the patients History and Physical, chart, labs and discussed the procedure including the risks, benefits and alternatives for the proposed anesthesia with the patient or authorized representative who has indicated his/her understanding and acceptance.     Dental Advisory Given  Plan Discussed with: Anesthesiologist, CRNA and Surgeon  Anesthesia Plan Comments: (Patient consented for risks of anesthesia including but not limited to:  - adverse  reactions to medications - risk of airway placement if required - damage to eyes, teeth, lips or other oral mucosa - nerve damage due to positioning  - sore throat or hoarseness - Damage to heart, brain, nerves, lungs, other parts of body or loss of life  Patient voiced understanding.)       Anesthesia Quick Evaluation

## 2023-04-26 NOTE — H&P (Signed)
Jasmine Minium, MD [facility] [address] [place], [place] [address] Phone:[phone] Fax : [phone]  Primary Care Physician:  Alan Mulder, MD Primary Gastroenterologist:  Dr. Servando Snare  Pre-Procedure History & Physical: HPI:  Jasmine Robertson is a 72 y.o. female is here for an endoscopy and colonoscopy.   Past Medical History:  Diagnosis Date   GERD (gastroesophageal reflux disease)    Hypertension    Hypothyroidism    Vertigo    when allergies are bad    Past Surgical History:  Procedure Laterality Date   BLADDER SURGERY     COLONOSCOPY      Prior to Admission medications   Medication Sig Start Date End Date Taking? Authorizing Provider  ALLEGRA-D ALLERGY & CONGESTION 60-120 MG 12 hr tablet Take 1 tablet by mouth 2 (two) times daily. 05/18/22  Yes [provider]  aspirin EC 81 MG tablet Take 81 mg by mouth daily. Swallow whole.   Yes [provider]  fluticasone (FLONASE) 50 MCG/ACT nasal spray fluticasone propionate 50 mcg/actuation nasal spray,suspension 06/19/18  Yes [provider]  levothyroxine (SYNTHROID) 50 MCG tablet Take 50 mcg by mouth daily before breakfast. 06/19/18  Yes [provider]  metoprolol succinate (TOPROL-XL) 25 MG 24 hr tablet Take 25 mg by mouth daily. 06/05/21  Yes [provider]  pantoprazole (PROTONIX) 40 MG tablet Take 40 mg by mouth 2 (two) times daily.   Yes [provider]  simvastatin (ZOCOR) 20 MG tablet Take 20 mg by mouth daily. 05/30/21  Yes [provider]  Vitamin D, Ergocalciferol, (DRISDOL) 1.25 MG (50000 UNIT) CAPS capsule Take 50,000 Units by mouth once a week. 06/09/22  Yes [provider]  Vitamin E (VITAMIN E/D-ALPHA NATURAL) 268 MG (400 UNIT) CAPS Take by mouth.   Yes [provider]  alendronate (FOSAMAX) 70 MG tablet Take 70 mg by mouth once a week. Patient not taking: Reported on 04/19/2023 03/21/23   [provider]  meclizine  (ANTIVERT) 12.5 MG tablet Take 1 tablet (12.5 mg total) by mouth 3 (three) times daily as needed for dizziness. Patient not taking: Reported on 04/19/2023 01/28/23   Valinda Hoar, NP    Allergies as of 04/03/2023 - Review Complete 01/28/2023  Allergen Reaction Noted   Aspirin Other (See Comments) 04/10/2018    Family History  Problem Relation Age of Onset   Breast cancer Mother 12       x2    Social History   Socioeconomic History   Marital status: Married    Spouse name: Not on file   Number of children: Not on file   Years of education: Not on file   Highest education level: Not on file  Occupational History   Not on file  Tobacco Use   Smoking status: Never   Smokeless tobacco: Never  Vaping Use   Vaping Use: Never used  Substance and Sexual Activity   Alcohol use: Never   Drug use: Never   Sexual activity: Not on file  Other Topics Concern   Not on file  Social History Narrative   Not on file   Social Determinants of Health   Financial Resource Strain: Not on file  Food Insecurity: Not on file  Transportation Needs: Not on file  Physical Activity: Not on file  Stress: Not on file  Social Connections: Not on file  Intimate Partner Violence: Not on file    Review of Systems: See HPI, otherwise negative ROS  Physical Exam: BP 131/65   Temp 97.9 F (36.6 C) (Temporal)   Resp 14   Ht 5\' 5"  (1.651 m)   Wt 78 kg   SpO2 100%   BMI 28.62 kg/m  General:   Alert,  pleasant and cooperative in NAD Head:  Normocephalic and atraumatic. Neck:  Supple; no masses or thyromegaly. Lungs:  Clear throughout to auscultation.    Heart:  Regular rate and rhythm. Abdomen:  Soft, nontender and nondistended. Normal bowel sounds, without guarding, and without rebound.   Neurologic:  Alert and  oriented x4;  grossly normal neurologically.  Impression/Plan: [name] is here for an endoscopy and colonoscopy to be performed for positive cologard and gerd  Risks,  benefits, limitations, and alternatives regarding  endoscopy and colonoscopy have been reviewed with the patient.  Questions have been answered.  All parties agreeable.   Jasmine Minium, MD  04/26/2023, 8:42 AM

## 2023-04-26 NOTE — Op Note (Signed)
[facility] Patient Name: Jasmine Robertson Procedure Date: 04/26/2023 8:48 AM MRN: [id number] Account #: [id number] Date of Birth: [birth year] Admit Type: Outpatient Age: 72 Room: [facility] OR ROOM 01 Gender: Female Note Status: Finalized Instrument Name: [id number] Procedure:             Colonoscopy Indications:           Positive Cologuard test Providers:             Midge Minium MD, MD Referring MD:          Alan Mulder, MD (Referring MD) Medicines:             Propofol per Anesthesia Complications:         No immediate complications. Procedure:             Pre-Anesthesia Assessment:                        - Prior to the procedure, a History and Physical was                         performed, and patient medications and allergies were                         reviewed. The patient's tolerance of previous                         anesthesia was also reviewed. The risks and benefits                         of the procedure and the sedation options and risks                         were discussed with the patient. All questions were                         answered, and informed consent was obtained. Prior                         Anticoagulants: The patient has taken no anticoagulant                         or antiplatelet agents. ASA Grade Assessment: II - A                         patient with mild systemic disease. After reviewing                         the risks and benefits, the patient was deemed in                         satisfactory condition to undergo the procedure.                        After obtaining informed consent, the colonoscope was                         passed under direct vision. Throughout the procedure,  the patient's blood pressure, pulse, and oxygen                         saturations were monitored continuously. The                         Colonoscope was introduced through the anus and                          advanced to the the cecum, identified by appendiceal                         orifice and ileocecal valve. The colonoscopy was                         performed without difficulty. The patient tolerated                         the procedure well. The quality of the bowel                         preparation was excellent. Findings:      The perianal and digital rectal examinations were normal.      Non-bleeding internal hemorrhoids were found during retroflexion. The       hemorrhoids were Grade I (internal hemorrhoids that do not prolapse).      The exam was otherwise without abnormality.      Random biopsies were obtained with cold forceps for histology randomly       in the entire colon. Impression:            - Non-bleeding internal hemorrhoids.                        - The examination was otherwise normal.                        - Random biopsies were obtained in the entire colon. Recommendation:        - Discharge patient to home.                        - Resume previous diet.                        - Continue present medications.                        - Await pathology results. Procedure Code(s):     --- Professional ---                        [phone], Colonoscopy, flexible; with biopsy, single or                         multiple Diagnosis Code(s):     --- Professional ---                        R19.5, Other fecal abnormalities CPT copyright 2022 [employer]. All rights reserved. The codes documented in this report are preliminary and upon coder review may  be revised to meet current compliance requirements.  Midge Minium MD, MD 04/26/2023 9:14:22 AM This report has been signed electronically. Number of Addenda: 0 Note Initiated On: 04/26/2023 8:48 AM Scope Withdrawal Time: 0 hours 7 minutes 45 seconds  Total Procedure Duration: 0 hours 10 minutes 48 seconds  Estimated Blood Loss:  Estimated blood loss: none.      [facility]

## 2023-04-30 ENCOUNTER — Encounter: Payer: Self-pay | Admitting: Gastroenterology

## 2023-04-30 LAB — SURGICAL PATHOLOGY

## 2023-05-01 ENCOUNTER — Encounter: Payer: Self-pay | Admitting: Gastroenterology

## 2023-06-05 ENCOUNTER — Other Ambulatory Visit: Payer: Self-pay | Admitting: Endocrinology

## 2023-06-05 DIAGNOSIS — Z1231 Encounter for screening mammogram for malignant neoplasm of breast: Secondary | ICD-10-CM

## 2023-06-25 ENCOUNTER — Ambulatory Visit
Admission: RE | Admit: 2023-06-25 | Discharge: 2023-06-25 | Disposition: A | Discharge status: Home / Self Care | Payer: Medicare HMO | Source: Ambulatory Visit | Attending: Endocrinology | Admitting: Endocrinology

## 2023-06-25 DIAGNOSIS — Z1231 Encounter for screening mammogram for malignant neoplasm of breast: Secondary | ICD-10-CM

## 2023-08-23 ENCOUNTER — Ambulatory Visit
Admission: EM | Admit: 2023-08-23 | Discharge: 2023-08-23 | Disposition: A | Discharge status: Home / Self Care | Payer: Medicare HMO | Attending: Emergency Medicine | Admitting: Emergency Medicine

## 2023-08-23 ENCOUNTER — Ambulatory Visit (INDEPENDENT_AMBULATORY_CARE_PROVIDER_SITE_OTHER): Payer: Medicare HMO

## 2023-08-23 VITALS — BP 150/84 | HR 69 | Temp 98.0°F | Resp 16

## 2023-08-23 DIAGNOSIS — J40 Bronchitis, not specified as acute or chronic: Secondary | ICD-10-CM | POA: Diagnosis not present

## 2023-08-23 MED ORDER — IPRATROPIUM BROMIDE 0.06 % NA SOLN: 2.0000 | Freq: Four times a day (QID) | NASAL | 12 refills | Status: AC

## 2023-08-23 MED ORDER — AEROCHAMBER MV MISC: 2 refills | Status: AC

## 2023-08-23 MED ORDER — DOXYCYCLINE HYCLATE 100 MG PO CAPS: 100.0000 mg | ORAL_CAPSULE | Freq: Two times a day (BID) | ORAL | 0 refills | Status: AC

## 2023-08-23 MED ORDER — PROMETHAZINE-DM 6.25-15 MG/5ML PO SYRP: 5.0000 mL | ORAL_SOLUTION | Freq: Four times a day (QID) | ORAL | 0 refills | Status: AC | PRN

## 2023-08-23 MED ORDER — ALBUTEROL SULFATE HFA 108 (90 BASE) MCG/ACT IN AERS: 2.0000 | INHALATION_SPRAY | RESPIRATORY_TRACT | 0 refills | Status: AC | PRN

## 2023-08-23 MED ORDER — BENZONATATE 100 MG PO CAPS: 200.0000 mg | ORAL_CAPSULE | Freq: Three times a day (TID) | ORAL | 0 refills | Status: AC

## 2023-08-23 NOTE — Discharge Instructions (Signed)
Use the albuterol inhaler/nebulizer every 4-6 hours as needed for shortness of breath, wheezing, and cough.  Take the doxycycline twice daily for 7 days for treatment of your bronchitis.  Use the Tessalon Perles every 8 hours for your cough.  Taken with a small sip of water.  They may give you some numbness to the base of your tongue or metallic taste in your mouth, this is normal.  They are designed to calm down the cough reflex.  Use the Promethazine DM cough syrup at bedtime as will make you drowsy.  You may take 1 teaspoon (5 mL) every 6 hours.  Return for reevaluation for new or worsening symptoms.

## 2023-08-23 NOTE — ED Triage Notes (Signed)
Pt c/o nasal congestion, cough and burning sensation in her chest since yesterday.

## 2023-08-23 NOTE — ED Provider Notes (Signed)
[facility]    CSN: [id number] Arrival date & time: 08/23/23  1013      History   Chief Complaint Chief Complaint  Patient presents with   Nasal Congestion   Cough    HPI Jasmine Robertson is a 72 y.o. female.   HPI  72 year old female with a past medical history significant for vertigo, hypothyroidism, hypertension, and GERD presents for evaluation of 1 weeks worth of respiratory symptoms to include runny nose and nasal congestion, nonproductive cough, wheezing, and shortness of breath.  She denies any fever, GI symptoms, headache, or bodyaches.  No known sick contacts or recent travel.  Past Medical History:  Diagnosis Date   GERD (gastroesophageal reflux disease)    Hypertension    Hypothyroidism    Vertigo    when allergies are bad    Patient Active Problem List   Diagnosis Date Noted   Positive colorectal cancer screening using Cologuard test 04/26/2023   Gastroesophageal reflux disease 04/26/2023    Past Surgical History:  Procedure Laterality Date   BLADDER SURGERY     COLONOSCOPY     COLONOSCOPY WITH PROPOFOL N/A 04/26/2023   Procedure: COLONOSCOPY WITH PROPOFOL;  Surgeon: Midge Minium, MD;  Location: [facility];  Service: Endoscopy;  Laterality: N/A;   ESOPHAGOGASTRODUODENOSCOPY (EGD) WITH PROPOFOL N/A 04/26/2023   Procedure: ESOPHAGOGASTRODUODENOSCOPY (EGD) WITH PROPOFOL with polypectomy;  Surgeon: Midge Minium, MD;  Location: [facility];  Service: Endoscopy;  Laterality: N/A;    OB History   No obstetric history on file.      Home Medications    Prior to Admission medications   Medication Sig Start Date End Date Taking? Authorizing Provider  albuterol (VENTOLIN HFA) 108 (90 Base) MCG/ACT inhaler Inhale 2 puffs into the lungs every 4 (four) hours as needed. 08/23/23  Yes Becky Augusta, NP  benzonatate (TESSALON) 100 MG capsule Take 2 capsules (200 mg total) by mouth every 8 (eight) hours. 08/23/23  Yes Becky Augusta, NP   doxycycline (VIBRAMYCIN) 100 MG capsule Take 1 capsule (100 mg total) by mouth 2 (two) times daily for 7 days. 08/23/23 08/30/23 Yes Becky Augusta, NP  ipratropium (ATROVENT) 0.06 % nasal spray Place 2 sprays into both nostrils 4 (four) times daily. 08/23/23  Yes Becky Augusta, NP  promethazine-dextromethorphan (PROMETHAZINE-DM) 6.25-15 MG/5ML syrup Take 5 mLs by mouth 4 (four) times daily as needed. 08/23/23  Yes Becky Augusta, NP  Spacer/Aero-Holding Chambers (AEROCHAMBER MV) inhaler Use as instructed 08/23/23  Yes Becky Augusta, NP  alendronate (FOSAMAX) 70 MG tablet Take 70 mg by mouth once a week. Patient not taking: Reported on 04/19/2023 03/21/23   [provider]  ALLEGRA-D ALLERGY & CONGESTION 60-120 MG 12 hr tablet Take 1 tablet by mouth 2 (two) times daily. 05/18/22   [provider]  aspirin EC 81 MG tablet Take 81 mg by mouth daily. Swallow whole.    [provider]  fluticasone Aleda Grana) 50 MCG/ACT nasal spray fluticasone propionate 50 mcg/actuation nasal spray,suspension 06/19/18   [provider]  levothyroxine (SYNTHROID) 50 MCG tablet Take 50 mcg by mouth daily before breakfast. 06/19/18   [provider]  meclizine (ANTIVERT) 12.5 MG tablet Take 1 tablet (12.5 mg total) by mouth 3 (three) times daily as needed for dizziness. Patient not taking: Reported on 04/19/2023 01/28/23   Valinda Hoar, NP  metoprolol succinate (TOPROL-XL) 25 MG 24 hr tablet Take 25 mg by mouth daily. 06/05/21   [provider]  pantoprazole (PROTONIX) 40  MG tablet Take 40 mg by mouth 2 (two) times daily.    [provider]  simvastatin (ZOCOR) 20 MG tablet Take 20 mg by mouth daily. 05/30/21   [provider]  Vitamin D, Ergocalciferol, (DRISDOL) 1.25 MG (50000 UNIT) CAPS capsule Take 50,000 Units by mouth once a week. 06/09/22   [provider]  Vitamin E (VITAMIN E/D-ALPHA NATURAL) 268 MG (400 UNIT) CAPS Take by mouth.    [provider]    Family History Family History  Problem Relation Age of Onset   Breast cancer Mother 63       x2    Social History Social History   Tobacco Use   Smoking status: Never   Smokeless tobacco: Never  Vaping Use   Vaping status: Never Used  Substance Use Topics   Alcohol use: Never   Drug use: Never     Allergies   Aspirin   Review of Systems Review of Systems  Constitutional:  Negative for fever.  HENT:  Positive for congestion and rhinorrhea. Negative for ear pain and sore throat.   Respiratory:  Positive for cough, shortness of breath and wheezing.   Gastrointestinal:  Negative for diarrhea, nausea and vomiting.  Musculoskeletal:  Negative for arthralgias and myalgias.  Neurological:  Negative for headaches.     Physical Exam Triage Vital Signs ED Triage Vitals [08/23/23 1104]  Encounter Vitals Group     BP      Systolic BP Percentile      Diastolic BP Percentile      Pulse      Resp      Temp      Temp src      SpO2      Weight      Height      Head Circumference      Peak Flow      Pain Score 0     Pain Loc      Pain Education      Exclude from Growth Chart    No data found.  Updated Vital Signs BP (!) 150/84 (BP Location: Left Arm)   Pulse 69   Temp 98 F (36.7 C)   Resp 16   SpO2 100%   Visual Acuity Right Eye Distance:   Left Eye Distance:   Bilateral Distance:    Right Eye Near:   Left Eye Near:    Bilateral Near:     Physical Exam Vitals and nursing note reviewed.  Constitutional:      Appearance: Normal appearance. She is not ill-appearing.  HENT:     Head: Normocephalic and atraumatic.     Right Ear: Tympanic membrane, ear canal and external ear normal. There is no impacted cerumen.     Left Ear: Tympanic membrane, ear canal and external ear normal. There is no impacted cerumen.     Nose: Congestion present. No rhinorrhea.     Comments: This mucosa is erythematous and mildly edematous without appreciable  discharge.    Mouth/Throat:     Mouth: Mucous membranes are moist.     Pharynx: Oropharynx is clear. No oropharyngeal exudate or posterior oropharyngeal erythema.  Cardiovascular:     Rate and Rhythm: Normal rate and regular rhythm.     Pulses: Normal pulses.     Heart sounds: Normal heart sounds. No murmur heard.    No friction rub. No gallop.  Pulmonary:     Effort: Pulmonary effort is normal.  Breath sounds: No wheezing or rales.     Comments: Breath sounds decreased globally. Musculoskeletal:     Cervical back: Normal range of motion and neck supple.  Lymphadenopathy:     Cervical: No cervical adenopathy.  Skin:    General: Skin is warm and dry.     Capillary Refill: Capillary refill takes less than 2 seconds.  Neurological:     General: No focal deficit present.     Mental Status: She is alert and oriented to person, place, and time.      UC Treatments / Results  Labs (all labs ordered are listed, but only abnormal results are displayed) Labs Reviewed - No data to display  EKG   Radiology DG Chest 2 View  Result Date: 08/23/2023 CLINICAL DATA:  Cough for the past week. Burning sensation in her chest since yesterday. EXAM: CHEST - 2 VIEW COMPARISON:  None Available. FINDINGS: The heart size and mediastinal contours are within normal limits. No focal consolidation, pleural effusion, or pneumothorax. The visualized skeletal structures are unremarkable. IMPRESSION: No active cardiopulmonary disease. Electronically Signed   By: Obie Dredge M.D.   On: 08/23/2023 12:21    Procedures Procedures (including critical care time)  Medications Ordered in UC Medications - No data to display  Initial Impression / Assessment and Plan / UC Course  I have reviewed the triage vital signs and the nursing notes.  Pertinent labs & imaging results that were available during my care of the patient were reviewed by me and considered in my medical decision making (see chart for  details).   Patient is a nontoxic-appearing 88 old female presenting for evaluation of 1 week with the respiratory symptoms as outlined HPI above.  She came today because she started develop a burning in the central part of her chest with respirations and coughing yesterday.  Her cough is nonproductive.  She is afebrile with an oral temp of 98 here in clinic.  Triage respiratory rate is 16 with 100% room air oxygen saturation.  She is able to speak in full sentence without dyspnea or tachypnea.  Physical exam does reveal mild edema and erythema of her nasal mucosa without appreciable discharge or rhinorrhea.  Cardiopulmonary exam does have diffusely decreased breath sounds but no wheezes or rhonchi appreciated.  I will obtain a chest x-ray to evaluate for any acute cardiopulmonary pathology.  Chest x-ray independently reviewed and evaluated by me.  Impression: Patient has questionable infiltrate versus effusion in the right lower lobe.  Radiology overread is pending. Impression states no active cardiopulmonary disease.  I will discharge patient home with a diagnosis of bronchitis and started on doxycycline 100 mg twice daily for 7 days.  Also albuterol inhaler and spacer, 2 puffs every 4-6 hours as needed for shortness of breath and wheezing.  Atrovent is a very for nasal congestion, Tessalon Perles and Promethazine DM cough syrup for cough and congestion.  Return and ER precautions reviewed.  Final Clinical Impressions(s) / UC Diagnoses   Final diagnoses:  Bronchitis     Discharge Instructions      Use the albuterol inhaler/nebulizer every 4-6 hours as needed for shortness of breath, wheezing, and cough.  Take the doxycycline twice daily for 7 days for treatment of your bronchitis.  Use the Tessalon Perles every 8 hours for your cough.  Taken with a small sip of water.  They may give you some numbness to the base of your tongue or metallic taste in your mouth, this is  normal.  They are  designed to calm down the cough reflex.  Use the Promethazine DM cough syrup at bedtime as will make you drowsy.  You may take 1 teaspoon (5 mL) every 6 hours.  Return for reevaluation for new or worsening symptoms.      ED Prescriptions     Medication Sig Dispense Auth. Provider   Spacer/Aero-Holding Chambers (AEROCHAMBER MV) inhaler Use as instructed 1 each Becky Augusta, NP   albuterol (VENTOLIN HFA) 108 (90 Base) MCG/ACT inhaler Inhale 2 puffs into the lungs every 4 (four) hours as needed. 18 g Becky Augusta, NP   benzonatate (TESSALON) 100 MG capsule Take 2 capsules (200 mg total) by mouth every 8 (eight) hours. 21 capsule Becky Augusta, NP   ipratropium (ATROVENT) 0.06 % nasal spray Place 2 sprays into both nostrils 4 (four) times daily. 15 mL Becky Augusta, NP   promethazine-dextromethorphan (PROMETHAZINE-DM) 6.25-15 MG/5ML syrup Take 5 mLs by mouth 4 (four) times daily as needed. 118 mL Becky Augusta, NP   doxycycline (VIBRAMYCIN) 100 MG capsule Take 1 capsule (100 mg total) by mouth 2 (two) times daily for 7 days. 14 capsule Becky Augusta, NP      PDMP not reviewed this encounter.   Becky Augusta, NP 08/23/23 1252

## 2024-06-23 ENCOUNTER — Other Ambulatory Visit: Payer: Self-pay | Admitting: Internal Medicine

## 2024-06-23 DIAGNOSIS — Z1231 Encounter for screening mammogram for malignant neoplasm of breast: Secondary | ICD-10-CM

## 2024-07-30 ENCOUNTER — Ambulatory Visit
Admission: RE | Admit: 2024-07-30 | Discharge: 2024-07-30 | Disposition: A | Discharge status: Home / Self Care | Source: Ambulatory Visit | Attending: Internal Medicine | Admitting: Internal Medicine

## 2024-07-30 DIAGNOSIS — Z1231 Encounter for screening mammogram for malignant neoplasm of breast: Secondary | ICD-10-CM | POA: Diagnosis present
